# Patient Record
Sex: Female | Born: 1970
Health system: Southern US, Community
[De-identification: ages and names within clinical notes are randomized; demographics above are authoritative.]

## PROBLEM LIST (undated history)

## (undated) DIAGNOSIS — F329 Major depressive disorder, single episode, unspecified: Secondary | ICD-10-CM

## (undated) DIAGNOSIS — F32A Depression, unspecified: Secondary | ICD-10-CM

## (undated) DIAGNOSIS — I471 Supraventricular tachycardia, unspecified: Secondary | ICD-10-CM

## (undated) DIAGNOSIS — I341 Nonrheumatic mitral (valve) prolapse: Secondary | ICD-10-CM

## (undated) DIAGNOSIS — G43909 Migraine, unspecified, not intractable, without status migrainosus: Secondary | ICD-10-CM

## (undated) HISTORY — PX: APPENDECTOMY: SHX54

## (undated) HISTORY — PX: AUGMENTATION MAMMAPLASTY: SUR837

## (undated) HISTORY — PX: ABDOMINAL HYSTERECTOMY: SHX81

---

## 1997-04-02 HISTORY — PX: CARDIAC ELECTROPHYSIOLOGY STUDY AND ABLATION: SHX1294

## 1997-06-26 ENCOUNTER — Inpatient Hospital Stay (HOSPITAL_COMMUNITY): Admission: AD | Admit: 1997-06-26 | Discharge: 1997-06-26 | Payer: Self-pay | Admitting: Obstetrics & Gynecology

## 1997-07-07 ENCOUNTER — Ambulatory Visit (HOSPITAL_COMMUNITY): Admission: RE | Admit: 1997-07-07 | Discharge: 1997-07-07 | Payer: Self-pay | Admitting: Obstetrics and Gynecology

## 1997-07-09 ENCOUNTER — Inpatient Hospital Stay (HOSPITAL_COMMUNITY): Admission: AD | Admit: 1997-07-09 | Discharge: 1997-07-16 | Payer: Self-pay | Admitting: Cardiovascular Disease

## 1997-09-03 ENCOUNTER — Inpatient Hospital Stay (HOSPITAL_COMMUNITY): Admission: AD | Admit: 1997-09-03 | Discharge: 1997-09-05 | Payer: Self-pay | Admitting: Obstetrics and Gynecology

## 1997-09-03 ENCOUNTER — Encounter (HOSPITAL_COMMUNITY): Admission: RE | Admit: 1997-09-03 | Discharge: 1997-09-06 | Payer: Self-pay | Admitting: Obstetrics & Gynecology

## 1997-12-08 ENCOUNTER — Inpatient Hospital Stay (HOSPITAL_COMMUNITY): Admission: AD | Admit: 1997-12-08 | Discharge: 1997-12-11 | Payer: Self-pay | Admitting: Cardiology

## 1998-05-30 ENCOUNTER — Ambulatory Visit (HOSPITAL_COMMUNITY): Admission: RE | Admit: 1998-05-30 | Discharge: 1998-05-30 | Payer: Self-pay | Admitting: Obstetrics and Gynecology

## 1998-06-21 ENCOUNTER — Encounter: Admission: RE | Admit: 1998-06-21 | Discharge: 1998-09-19 | Payer: Self-pay | Admitting: Cardiology

## 2000-06-12 ENCOUNTER — Encounter (INDEPENDENT_AMBULATORY_CARE_PROVIDER_SITE_OTHER): Payer: Self-pay

## 2000-06-12 ENCOUNTER — Other Ambulatory Visit: Admission: RE | Admit: 2000-06-12 | Discharge: 2000-06-12 | Payer: Self-pay | Admitting: Obstetrics and Gynecology

## 2000-12-30 ENCOUNTER — Encounter: Payer: Self-pay | Admitting: Obstetrics and Gynecology

## 2000-12-30 ENCOUNTER — Encounter (INDEPENDENT_AMBULATORY_CARE_PROVIDER_SITE_OTHER): Payer: Self-pay | Admitting: Specialist

## 2000-12-30 ENCOUNTER — Ambulatory Visit (HOSPITAL_COMMUNITY): Admission: RE | Admit: 2000-12-30 | Discharge: 2000-12-30 | Payer: Self-pay | Admitting: Obstetrics and Gynecology

## 2001-12-18 ENCOUNTER — Other Ambulatory Visit: Admission: RE | Admit: 2001-12-18 | Discharge: 2001-12-18 | Payer: Self-pay | Admitting: Obstetrics and Gynecology

## 2002-12-01 ENCOUNTER — Other Ambulatory Visit: Admission: RE | Admit: 2002-12-01 | Discharge: 2002-12-01 | Payer: Self-pay | Admitting: Obstetrics and Gynecology

## 2003-02-03 ENCOUNTER — Ambulatory Visit (HOSPITAL_COMMUNITY): Admission: RE | Admit: 2003-02-03 | Discharge: 2003-02-03 | Payer: Self-pay | Admitting: *Deleted

## 2004-12-21 ENCOUNTER — Emergency Department (HOSPITAL_COMMUNITY): Admission: EM | Admit: 2004-12-21 | Discharge: 2004-12-21 | Payer: Self-pay | Admitting: Family Medicine

## 2004-12-25 ENCOUNTER — Inpatient Hospital Stay (HOSPITAL_COMMUNITY): Admission: AD | Admit: 2004-12-25 | Discharge: 2004-12-25 | Payer: Self-pay | Admitting: Obstetrics and Gynecology

## 2004-12-26 ENCOUNTER — Other Ambulatory Visit: Admission: RE | Admit: 2004-12-26 | Discharge: 2004-12-26 | Payer: Self-pay | Admitting: Obstetrics and Gynecology

## 2004-12-28 ENCOUNTER — Encounter (INDEPENDENT_AMBULATORY_CARE_PROVIDER_SITE_OTHER): Payer: Self-pay | Admitting: Specialist

## 2004-12-28 ENCOUNTER — Ambulatory Visit (HOSPITAL_COMMUNITY): Admission: RE | Admit: 2004-12-28 | Discharge: 2004-12-29 | Payer: Self-pay | Admitting: Obstetrics and Gynecology

## 2004-12-29 ENCOUNTER — Observation Stay (HOSPITAL_COMMUNITY): Admission: AD | Admit: 2004-12-29 | Discharge: 2004-12-30 | Payer: Self-pay | Admitting: Obstetrics and Gynecology

## 2005-02-09 ENCOUNTER — Ambulatory Visit: Payer: Self-pay | Admitting: Gastroenterology

## 2006-08-23 ENCOUNTER — Emergency Department (HOSPITAL_COMMUNITY): Admission: EM | Admit: 2006-08-23 | Discharge: 2006-08-23 | Payer: Self-pay | Admitting: Emergency Medicine

## 2006-10-15 ENCOUNTER — Emergency Department (HOSPITAL_COMMUNITY): Admission: EM | Admit: 2006-10-15 | Discharge: 2006-10-15 | Payer: Self-pay | Admitting: Emergency Medicine

## 2007-01-01 ENCOUNTER — Emergency Department (HOSPITAL_COMMUNITY): Admission: EM | Admit: 2007-01-01 | Discharge: 2007-01-01 | Payer: Self-pay | Admitting: Emergency Medicine

## 2008-08-18 ENCOUNTER — Encounter: Admission: RE | Admit: 2008-08-18 | Discharge: 2008-08-18 | Payer: Self-pay | Admitting: Family Medicine

## 2008-09-02 ENCOUNTER — Emergency Department (HOSPITAL_COMMUNITY): Admission: EM | Admit: 2008-09-02 | Discharge: 2008-09-02 | Payer: Self-pay | Admitting: Emergency Medicine

## 2008-11-12 ENCOUNTER — Emergency Department (HOSPITAL_COMMUNITY): Admission: EM | Admit: 2008-11-12 | Discharge: 2008-11-12 | Payer: Self-pay | Admitting: Emergency Medicine

## 2008-11-13 ENCOUNTER — Ambulatory Visit (HOSPITAL_COMMUNITY): Admission: RE | Admit: 2008-11-13 | Discharge: 2008-11-13 | Payer: Self-pay | Admitting: Emergency Medicine

## 2009-07-07 ENCOUNTER — Ambulatory Visit: Payer: Self-pay | Admitting: Psychology

## 2010-07-08 LAB — POCT I-STAT, CHEM 8
BUN: 14 mg/dL (ref 6–23)
Calcium, Ion: 0.96 mmol/L — ABNORMAL LOW (ref 1.12–1.32)
Chloride: 108 mEq/L (ref 96–112)
Creatinine, Ser: 0.9 mg/dL (ref 0.4–1.2)
Glucose, Bld: 72 mg/dL (ref 70–99)
HCT: 45 % (ref 36.0–46.0)
Sodium: 136 mEq/L (ref 135–145)

## 2010-07-08 LAB — DIFFERENTIAL
Basophils Relative: 3 % — ABNORMAL HIGH (ref 0–1)
Eosinophils Absolute: 0.1 10*3/uL (ref 0.0–0.7)
Eosinophils Relative: 3 % (ref 0–5)
Lymphocytes Relative: 24 % (ref 12–46)
Neutro Abs: 3.3 10*3/uL (ref 1.7–7.7)
Neutrophils Relative %: 61 % (ref 43–77)

## 2010-07-08 LAB — CBC: HCT: 44.6 % (ref 36.0–46.0)

## 2010-07-10 LAB — DIFFERENTIAL
Lymphocytes Relative: 17 % (ref 12–46)
Neutro Abs: 3.5 10*3/uL (ref 1.7–7.7)
Neutrophils Relative %: 71 % (ref 43–77)

## 2010-07-10 LAB — URINALYSIS, ROUTINE W REFLEX MICROSCOPIC
Glucose, UA: NEGATIVE mg/dL
Protein, ur: NEGATIVE mg/dL

## 2010-07-10 LAB — CBC
MCHC: 33.9 g/dL (ref 30.0–36.0)
RBC: 4.73 MIL/uL (ref 3.87–5.11)
RDW: 13.7 % (ref 11.5–15.5)

## 2010-07-10 LAB — SAMPLE TO BLOOD BANK

## 2010-08-18 NOTE — Op Note (Signed)
NAMECLEA, Dawn Burton NO.:  [id number]   MEDICAL RECORD NO.:  [id number]          PATIENT TYPE:  OIB   LOCATION:  1613                         FACILITY:  [facility]   PHYSICIAN:  Malva Limes, M.D.    DATE OF BIRTH:  1970-05-16   DATE OF PROCEDURE:  12/28/2004  DATE OF DISCHARGE:                                 OPERATIVE REPORT   PREOPERATIVE DIAGNOSES:  1.  Severe pelvic pain.  2.  Necrosing submucosal fibroid.  3.  Dysmenorrhea.   POSTOPERATIVE DIAGNOSES:  1.  Severe pelvic pain.  2.  Necrosing submucosal fibroid.  3.  Dysmenorrhea.   PROCEDURE:  Total vaginal hysterectomy.   SURGEON:  Malva Limes, M.D.   ASSISTANT:  Dr. Laural Roes.   ANESTHESIA:  General endotracheal.   ANTIBIOTICS:  Ancef 1 gm.   ESTIMATED BLOOD LOSS:  150 cc.   COMPLICATIONS:  None.   SPECIMENS:  Cervix, uterus, sent to pathology.   DRAINS:  Foley, bedside drainage.   PROCEDURE:  Patient was taken to the operating room, where she was placed in  dorsal supine position.  A general anesthetic was administered without  complications.  She was then placed in the dorsal lithotomy position.  She  was prepped with Hibiclens and draped in the usual fashion for this  procedure.  Her bladder was drained for a red rubber catheter.  A weighted  speculum was placed in the vagina.  The cervix was grasped with a single-  tooth tenaculum, and 10 cc of 1% lidocaine was injected into the cervix.  The posterior cul de sac was entered sharply.  The uterosacral ligaments  were bilaterally clamped, cut, and ligated with an 0 Monocryl suture.  The  cervix was then circumscribed.  The anterior cul de sac was entered sharply.  The bladder pillars were bilaterally clamped, cut, and ligated with 0  Monocryl suture.  The cardinal ligaments were serially clamped, cut, and  ligated with 0 Monocryl suture.  The uterine vessels were bilaterally  clamped, cut, and ligated with 0 Monocryl suture.  Once the level of  the  fallopian tube was reached, the uterus was inverted and the triple pedicle,  which included the fallopian tube, round ligament, and ovarian ligament,  were clamped, cut, and ligated x2 with 0 Monocryl suture.  The ovaries were  inspected and found to be normal.  The patient did have extensive dilation  of her infundibulopelvic ligament, consistent with a diagnosis of pelvic  congestive syndrome.  At this point, all pedicles were checked and found to  be hemostatic.  The posterior vaginal cuff was then run, using 2-0 Vicryl in  a running, locking fashion.  The uterosacral ligaments were reapproximated  in the midline.  The remaining vagina was then closed using 2-0 Vicryl in a  running, locking fashion.  The cuff was dried at the end of the procedure.  A Foley catheter was placed.  Clear yellow urine was seen.  The patient  tolerated the procedure well.  She was taken to the recovery room in stable  condition.  Instrument and lap counts were correct x2.  ______________________________  Malva Limes, M.D.     MA/MEDQ  D:  12/28/2004  T:  12/28/2004  Job:  [id number]

## 2010-08-18 NOTE — Discharge Summary (Signed)
[name], Dawn Burton NO.:  [id number]   MEDICAL RECORD NO.:  [id number]          PATIENT TYPE:  OIB   LOCATION:  1613                         FACILITY:  [facility]   PHYSICIAN:  Malva Limes, M.D.    DATE OF BIRTH:  1970-11-23   DATE OF ADMISSION:  12/28/2004  DATE OF DISCHARGE:  12/29/2004                                 DISCHARGE SUMMARY   PRINCIPAL DISCHARGE DIAGNOSES:  1.  Chronic pelvic pain.  2.  Uterine fibroids.  3.  Dysmenorrhea.  4.  Hematometra.   PROCEDURE:  Total vaginal hysterectomy.   HOSPITAL COURSE:  [name] is a 40 year old white female G2, P2-0-0-3 who  presented to [facility] on 12/28/04 for a total vaginal  hysterectomy secondary to pelvic pain, dysmenorrhea and necrosing submucosal  fibroid. The patient had an acute onset of pelvic pain approximately one  week prior to admission. She was seen in the emergency room. She had a CT  scan and pelvic ultrasound. The only thing that was identified during that  visit was what was thought to be a 3-cm submucosal necrosing fibroid. The  patient had significant pain requiring Dilaudid for relief. The patient does  have a history of cryoablation of the endometrium secondary to menorrhagia.  The patient was currently missing work because of the pain. The patient  underwent a total vaginal hysterectomy on 12/28/04. This was uncomplicated.  Blood loss was 150 cubic centimeters. Hemoglobin stable postop. The patient  was found to have hematometra at the time of the surgery. This was likely  the cause of the significant pain. The patient's postop course was benign.  She was ambulating without difficulty, eating a regular diet and passing  flatus at the time of discharge. She remained afebrile.   She was discharged to home on Dilaudid. She was instructed to follow up in  the office in 4 weeks.           ______________________________  Malva Limes, M.D.     MA/MEDQ  D:  12/29/2004  T:   12/29/2004  Job:  [id number]

## 2010-08-18 NOTE — Op Note (Signed)
Dawn Burton, Dawn Burton NO.:  [id number]   MEDICAL RECORD NO.:  [id number]                   PATIENT TYPE:  AMB   LOCATION:  SDC                                  FACILITY:  [facility]   PHYSICIAN:  Alfredia Ferguson, M.D.               DATE OF BIRTH:  [birth year]   DATE OF PROCEDURE:  02/03/2003  DATE OF DISCHARGE:  02/03/2003                                 OPERATIVE REPORT   PREOPERATIVE DIAGNOSIS:  Bilateral hypomastia.   POSTOPERATIVE DIAGNOSIS:  Bilateral hypomastia.   OPERATION PERFORMED:  Bilateral augmentation mammoplasty with McGhan smooth-  shell saline implants, 480 mL, inflated to a total volume of 510 mL on the  left and 510 mL on the right.  Implants placed in the submuscular position.   SURGEON:  Alfredia Ferguson, M.D.   ANESTHESIA:  General endotracheal anesthesia.   INDICATION FOR SURGERY:  This is a 40 year old woman who is undergoing GYN  surgery on today's date and wishes to undergo breast augmentation at the  same time.  She was advised regarding potential risks of the surgery,  including being made too large, not being made large enough, asymmetry of  the breasts, malposition of the implants, capsular contracture, rippling,  uncertain life span of the implants and the need to replace the implants on  multiple occasions over her lifetime, hematoma, seroma, infection, numbness  of the nipple-areolar complex, and overall dissatisfaction with the results.  In spite of these and other risks discussed with the patient, she wishes to  proceed with the operation.   DESCRIPTION OF SURGERY:  Following adequate general endotracheal anesthesia,  the patient's chest was prepped with Betadine and draped with sterile  drapes.  Skin marks were then placed prior to the patient being taken to the  operating room outlining the natural dimensions of the breasts.  Attention  was directed to the right breast, where a 3 cm inframammary crease incision  was  made and deepened until reaching the inferior pectoralis fascia.  The  fascia was opened inferiorly.  The muscle fibers were divided for a distance  of approximately 4 cm inferiorly and a subpectoral plane was reached.  Using  a combination of blunt and electrocautery dissection, a pocket of adequate  size to accommodate a 480 mL smooth-shell McGhan saline implant was created.  Hemostasis was meticulously maintained throughout the dissection.  Once the  pocket had been created, a saline implant was prepared by evacuating the air  and placing 100 mL of saline.  The implant was placed in the desired  position and then inflated to a total volume of 510 mL.  The fill tubing was  removed and the cap over the fill port was pushed down into the opening.  The deeper breast tissue was now closed using interrupted 3-0 Vicryl suture.  The dermis was closed on the skin incision using similar suture.  Steri-  Strips were used for  the skin edges.  Attention was directed to the right  breast, where an identical procedure was performed.  The exact amount of  saline was used to  inflate the saline implant.  Symmetry was acceptable at the conclusion of  the procedure.  Estimated blood loss was less than 10 mL.  A bulky dressing  was placed over the breasts and a circumferential wrap using a six-inch Ace  was used.  At the conclusion of the procedure, it was turned over to a  gynecologist for his surgical procedure.                                               Alfredia Ferguson, M.D.    WBB/MEDQ  D:  02/23/2003  T:  02/23/2003  Job:  [id number]

## 2010-08-18 NOTE — Op Note (Signed)
   NAMESHON, MANSOURI NO.:  [id number]   MEDICAL RECORD NO.:  [id number]                   PATIENT TYPE:  AMB   LOCATION:  SDC                                  FACILITY:  [facility]   PHYSICIAN:  Malva Limes, M.D.                 DATE OF BIRTH:  03-03-1971   DATE OF PROCEDURE:  02/03/2003  DATE OF DISCHARGE:                                 OPERATIVE REPORT   PREOPERATIVE DIAGNOSIS:  Menorrhagia.   POSTOPERATIVE DIAGNOSIS:  Menorrhagia.   PROCEDURE:  Endometrial ablation with the vapor probe.   SURGEON:  Malva Limes, M.D.   ANESTHESIA:  General.   ANTIBIOTICS:  Ancef 1 gram.   DRAINS:  Red rubber catheter, bladder.   ESTIMATED BLOOD LOSS:  Minimal.   COMPLICATIONS:  None.   SPECIMENS:  None.   PROCEDURE:  The patient was in the operating room for breast augmentation  prior to my arrival.  Once this was completed, the patient was placed in the  dorsal lithotomy position.  She was prepped with Hibiclens and her bladder  was drained with a red rubber catheter.  She was then draped in the usual  fashion for this procedure.  The sterile speculum was placed in the vagina,  the single-tooth tenaculum was applied to the anterior cervical lip.  The  cervical os was then serially dilated to a 31 [place]. The hysteroscope was  advanced into the endometrial cavity and examination revealed no endometrial  polyps or fibroids.  At this point the hysteroscope was removed and sharp  curettage was performed.  Following this, the resectoscope was set up with  the vapor probe set at 150 for cut and 60 for coag.  Following this the  resectoscope was placed in the uterine cavity and all areas of endometrium  were treated with the vapor probe.  Estimated fluid loss during the  procedure was 130 mL.  The patient tolerated the procedure well and she was  taken to the recovery room in stable condition.  Instrument and lap count  was correct x1.  The patient will be  discharged to home.  She will be  instructed to follow up in the office in four weeks.                                               Malva Limes, M.D.    MA/MEDQ  D:  02/03/2003  T:  02/03/2003  Job:  [id number]

## 2010-08-18 NOTE — Op Note (Signed)
[facility]  Patient:    Dawn Burton, Dawn Burton Visit Number: [id number] MRN: [id number]          Service Type: DSU Location: [facility] Attending Physician:  Osborn Coho Dictated by:   Janeece Riggers Dareen Piano, M.D. Proc. Date: 12/30/00 Admit Date:  12/30/2000                             Operative Report  PREOPERATIVE DIAGNOSIS:       Menorrhagia.  POSTOPERATIVE DIAGNOSIS:      Menorrhagia.  PROCEDURE:                    Endometrial ablation, cryoablation.  SURGEON:                      Mark E. Dareen Piano, M.D.  ANESTHESIA:                   General.  ESTIMATED BLOOD LOSS:         Minimal.  COMPLICATIONS:                None.  SPECIMENS:                    Endometrial curettings sent to pathology.  DRAINS:                       Red rubber catheter to bladder.  ANTIBIOTICS:                  Ancef 1 g.  DESCRIPTION OF PROCEDURE:     The patient was taken to the operating room where she was placed in a dorsal supine position. A general anesthetic was administered without complications. She was then placed in dorsal lithotomy position and prepped with Hibiclens. She was draped in the usual fashion for this procedure. A sterile speculum was placed in the vagina. A single tooth tenaculum was applied to the anterior cervical lip. The uterus was then sounded to 9 cm. The cervical os was serially dilated to a 27 [place]. A curettage was then performed with endometrial curettings sent to pathology. The cryo probe was then placed in the right cornua and turned on for six minutes. Ultrasound observation demonstrated that the probe tip was in the right cornua, well formed cryo ball was seen to develop around the probe. A similar procedure was performed on the opposite side. Again, both sides were treated for six minutes. This concluded the procedure. The speculum was removed. The patient was then taken to the recovery room in stable condition. She will be  discharged to home. She will follow up in the office in four weeks. She will be sent home with Tylox and Anaprox Double Strength to take as needed. Dictated by:   Janeece Riggers Dareen Piano, M.D. Attending Physician:  Osborn Coho DD:  12/30/00 TD:  12/30/00 Job: [id number]

## 2011-01-11 LAB — I-STAT 8, (EC8 V) (CONVERTED LAB)
Acid-base deficit: 1
BUN: 8
Chloride: 105
HCT: 47 — ABNORMAL HIGH
Sodium: 139
TCO2: 25
pH, Ven: 7.385 — ABNORMAL HIGH

## 2011-01-11 LAB — POCT CARDIAC MARKERS
CKMB, poc: <1 — ABNORMAL LOW
Myoglobin, poc: 70.8
Operator id: 294521

## 2011-01-11 LAB — POCT I-STAT CREATININE
Creatinine, Ser: 1
Operator id: 294521

## 2011-01-11 LAB — PROTIME-INR: INR: 1

## 2011-01-15 LAB — STOOL CULTURE

## 2011-01-15 LAB — CLOSTRIDIUM DIFFICILE EIA

## 2011-01-15 LAB — GIARDIA/CRYPTOSPORIDIUM SCREEN(EIA): Giardia Screen - EIA: NEGATIVE

## 2012-10-18 ENCOUNTER — Emergency Department (HOSPITAL_COMMUNITY)
Admission: EM | Admit: 2012-10-18 | Discharge: 2012-10-18 | Disposition: A | Discharge status: Home / Self Care | Payer: BC Managed Care – PPO | Source: Home / Self Care

## 2012-10-18 ENCOUNTER — Encounter (HOSPITAL_COMMUNITY): Payer: Self-pay | Admitting: Emergency Medicine

## 2012-10-18 DIAGNOSIS — J029 Acute pharyngitis, unspecified: Secondary | ICD-10-CM

## 2012-10-18 HISTORY — DX: Migraine, unspecified, not intractable, without status migrainosus: G43.909

## 2012-10-18 MED ORDER — METHYLPREDNISOLONE ACETATE 40 MG/ML IJ SUSP
80.0000 mg | Freq: Once | INTRAMUSCULAR | Status: AC
  Administered 2012-10-18: 80 mg via INTRAMUSCULAR

## 2012-10-18 MED ORDER — FLUTICASONE PROPIONATE 50 MCG/ACT NA SUSP: 2.0000 | Freq: Every day | NASAL | Status: DC

## 2012-10-18 MED ORDER — METHYLPREDNISOLONE ACETATE 80 MG/ML IJ SUSP
INTRAMUSCULAR | Status: AC
  Filled 2012-10-18: qty 1

## 2012-10-18 MED ORDER — FEXOFENADINE-PSEUDOEPHED ER 60-120 MG PO TB12: 1.0000 | ORAL_TABLET | Freq: Two times a day (BID) | ORAL | Status: DC

## 2012-10-18 MED ORDER — GUAIFENESIN 100 MG/5ML PO LIQD: 100.0000 mg | Freq: Three times a day (TID) | ORAL | Status: DC | PRN

## 2012-10-18 NOTE — ED Notes (Signed)
Patient has a sore throat and swollen neck, sore neck for one week.  Reports sore throat worsening.  Reports ears are itchy and stuffy.  Denies runny nose.  Patient also complains of feeling off balance.

## 2012-10-18 NOTE — ED Provider Notes (Signed)
History    CSN: [id number] Arrival date & time 10/18/12  1611  None    Chief Complaint  Patient presents with  . Sore Throat   (Consider location/radiation/quality/duration/timing/severity/associated sxs/prior Treatment) HPI  42 yo wf presents with above complaint.  Sore throat x 7 days.  Pain with swallowing.  At times feels like her throat is closing off.  Denies fever, chills, dyspnea, cough.  Has postnasal drainage, nasal congestion.  Hx of seasonal allergies but not currently taking anything for this.   Past Medical History  Diagnosis Date  . Migraines    Past Surgical History  Procedure Laterality Date  . Abdominal hysterectomy     No family history on file. History  Substance Use Topics  . Smoking status: Never Smoker   . Smokeless tobacco: Not on file  . Alcohol Use: Yes   OB History   Grav Para Term Preterm Abortions TAB SAB Ect Mult Living                 Review of Systems  Constitutional: Negative for fever, chills, appetite change, fatigue and unexpected weight change.  HENT: Positive for congestion, neck pain and postnasal drip. Negative for ear pain and ear discharge.   Eyes: Negative.   Respiratory: Negative for apnea, cough, choking, chest tightness, shortness of breath, wheezing and stridor.   Cardiovascular: Negative.   Gastrointestinal: Negative.   Genitourinary: Negative.   Skin: Negative.   Neurological: Negative.   Hematological: Negative.   Psychiatric/Behavioral: Negative.     Allergies  Review of patient's allergies indicates no known allergies.  Home Medications   Current Outpatient Rx  Name  Route  Sig  Dispense  Refill  . topiramate (TOPAMAX) 50 MG tablet   Oral   Take 50 mg by mouth 2 (two) times daily.         . fexofenadine-pseudoephedrine (ALLEGRA-D) 60-120 MG per tablet   Oral   Take 1 tablet by mouth every 12 (twelve) hours.   30 tablet   1   . fluticasone (FLONASE) 50 MCG/ACT nasal spray   Nasal   Place 2 sprays  into the nose daily.   16 g   2   . guaiFENesin (ROBITUSSIN) 100 MG/5ML liquid   Oral   Take 5-10 mLs (100-200 mg total) by mouth 3 (three) times daily as needed for cough.   60 mL   1    BP 126/76  Pulse 52  Temp(Src) 98.2 F (36.8 C) (Oral)  Resp 16  SpO2 100% Physical Exam  Constitutional: She is oriented to person, place, and time. She appears well-developed and well-nourished.  HENT:  Head: Normocephalic and atraumatic.  Right Ear: External ear normal.  Left Ear: External ear normal.  Mouth/Throat: No oropharyngeal exudate.  Mild redness throat.   Eyes: Conjunctivae and EOM are normal. Pupils are equal, round, and reactive to light.  Neck: Normal range of motion. No JVD present. No tracheal deviation present. No thyromegaly present.  Cardiovascular: Normal rate and normal heart sounds.   Bradycardic   Pulmonary/Chest: Effort normal and breath sounds normal. No respiratory distress. She has no wheezes. She has no rales.  Musculoskeletal: Normal range of motion.  Lymphadenopathy:    She has cervical adenopathy.  Neurological: She is alert and oriented to person, place, and time.  Skin: Skin is warm and dry.  Psychiatric: She has a normal mood and affect.    ED Course  Procedures (including critical care time) Labs Reviewed  POCT  RAPID STREP A ([facility]ARE ONLY)   No results found. 1. Viral pharyngitis     MDM  Patient will f/u with [place] in 2-3 days if not getting better.  Must go to ED if she feels like she is having issues with swallowing or breathing.  Voices understanding.  All questions answered.    Meds ordered this encounter  Medications  . topiramate (TOPAMAX) 50 MG tablet    Sig: Take 50 mg by mouth 2 (two) times daily.  . methylPREDNISolone acetate (DEPO-MEDROL) injection 80 mg    Sig:   . fexofenadine-pseudoephedrine (ALLEGRA-D) 60-120 MG per tablet    Sig: Take 1 tablet by mouth every 12 (twelve) hours.    Dispense:  30 tablet    Refill:  1  .  fluticasone (FLONASE) 50 MCG/ACT nasal spray    Sig: Place 2 sprays into the nose daily.    Dispense:  16 g    Refill:  2  . guaiFENesin (ROBITUSSIN) 100 MG/5ML liquid    Sig: Take 5-10 mLs (100-200 mg total) by mouth 3 (three) times daily as needed for cough.    Dispense:  60 mL    Refill:  1    Zonia Kief, PA-C 10/18/12 1747

## 2012-10-18 NOTE — ED Provider Notes (Signed)
Medical screening examination/treatment/procedure(s) were performed by resident physician or non-physician practitioner and as supervising physician I was immediately available for consultation/collaboration.   Barkley Bruns MD.   Linna Hoff, MD 10/18/12 [phone]

## 2012-10-21 LAB — CULTURE, GROUP A STREP

## 2014-08-16 ENCOUNTER — Emergency Department (INDEPENDENT_AMBULATORY_CARE_PROVIDER_SITE_OTHER): Payer: BLUE CROSS/BLUE SHIELD

## 2014-08-16 ENCOUNTER — Encounter (HOSPITAL_COMMUNITY): Payer: Self-pay | Admitting: Emergency Medicine

## 2014-08-16 ENCOUNTER — Emergency Department (HOSPITAL_COMMUNITY)
Admission: EM | Admit: 2014-08-16 | Discharge: 2014-08-16 | Disposition: A | Discharge status: Home / Self Care | Payer: BLUE CROSS/BLUE SHIELD | Source: Home / Self Care | Attending: Family Medicine | Admitting: Family Medicine

## 2014-08-16 DIAGNOSIS — M778 Other enthesopathies, not elsewhere classified: Secondary | ICD-10-CM | POA: Diagnosis not present

## 2014-08-16 DIAGNOSIS — R21 Rash and other nonspecific skin eruption: Secondary | ICD-10-CM | POA: Diagnosis not present

## 2014-08-16 DIAGNOSIS — M25829 Other specified joint disorders, unspecified elbow: Secondary | ICD-10-CM

## 2014-08-16 MED ORDER — PREDNISONE 10 MG (48) PO TBPK: ORAL_TABLET | Freq: Every day | ORAL | Status: DC

## 2014-08-16 NOTE — ED Notes (Signed)
Pt states she is breaking out with a rash and not sure if its poison oak or poison ivy or if its allergic reaction to something she ate.

## 2014-08-16 NOTE — ED Provider Notes (Signed)
Dawn Burton is a 44 y.o. female who presents to Urgent Care today for elbow pain. 1) rash: Patient developed a rash on her trunk and extremities starting this morning. It is not particularly itchy. She denies any fevers or chills nausea vomiting or diarrhea. No new medications of detergents shampoos cosmetics etc. She has not tried any treatment yet. No tick bites. 2) left elbow pain: Present for about a month occurring after she fell on the elbow. No radiating pain weakness or numbness. She notes pain in the posterior aspect of the elbow with full extension and full flexion. She denies any clicking locking or catching. No radiating pain weakness or numbness fevers or chills.   Past Medical History  Diagnosis Date  . Migraines    Past Surgical History  Procedure Laterality Date  . Abdominal hysterectomy     History  Substance Use Topics  . Smoking status: Never Smoker   . Smokeless tobacco: Not on file  . Alcohol Use: Yes   ROS as above Medications: No current facility-administered medications for this encounter.   Current Outpatient Prescriptions  Medication Sig Dispense Refill  . fexofenadine-pseudoephedrine (ALLEGRA-D) 60-120 MG per tablet Take 1 tablet by mouth every 12 (twelve) hours. 30 tablet 1  . fluticasone (FLONASE) 50 MCG/ACT nasal spray Place 2 sprays into the nose daily. 16 g 2  . guaiFENesin (ROBITUSSIN) 100 MG/5ML liquid Take 5-10 mLs (100-200 mg total) by mouth 3 (three) times daily as needed for cough. 60 mL 1  . topiramate (TOPAMAX) 50 MG tablet Take 50 mg by mouth 2 (two) times daily.     No Known Allergies   Exam:  BP 130/85 mmHg  Pulse 65  Temp(Src) 97.5 F (36.4 C) (Oral)  Resp 16  SpO2 97% Gen: Well NAD HEENT: EOMI,  MMM no oral lesions or tongue or lip swelling Lungs: Normal work of breathing. CTABL Heart: RRR no MRG Abd: NABS, Soft. Nondistended, Nontender Exts: Brisk capillary refill, warm and well perfused.  Skin: Maculopapular  erythematous rash on trunk and extremities. Blanchable. Nontender. Left arm: shoulder nontender normal motion Elbow normal-appearing no swelling mildly tender posterior elbow and olecranon fossa She lacks full extension and flexion by about 5 and has pain with both. Excellent normal supination and pronation. No locking or catching. Wrist nontender normal pulses normal motion Hand normal grip strength sensation and capillary refill  No results found for this or any previous visit (from the past 24 hour(s)). Dg Elbow Complete Left  08/16/2014   CLINICAL DATA:  Injury 1 month ago. Pain with extension. Question loose body.  EXAM: LEFT ELBOW - COMPLETE 3+ VIEW  COMPARISON:  None.  FINDINGS: There is no evidence of fracture, dislocation, or joint effusion. There is no evidence of arthropathy or other focal bone abnormality. Soft tissues are unremarkable.  IMPRESSION: Negative.   Electronically Signed   By: Rolm Baptise M.D.   On: 08/16/2014 12:51    Assessment and Plan: 44 y.o. female with  1) rash: Unclear dermatitis. Likely allergic. Treat with prednisone Dosepak  2) elbow pain: likely olecranon impingement. Doubtful for loose body over this possibility. Plan on prednisone dose pack as above and work on full extension range of motion. Follow-up with orthopedics if not better.    Discussed warning signs or symptoms. Please see discharge instructions. Patient expresses understanding.     Gregor Hams, MD 08/16/14 [phone]

## 2014-08-16 NOTE — Discharge Instructions (Signed)
Thank you for coming in today. Keep an eye on the rash.  Return if it is worsening.  Take the prednisone.  Work on straightening the elbow  Rash A rash is a change in the color or texture of your skin. There are many different types of rashes. You may have other problems that accompany your rash. CAUSES   Infections.  Allergic reactions. This can include allergies to pets or foods.  Certain medicines.  Exposure to certain chemicals, soaps, or cosmetics.  Heat.  Exposure to poisonous plants.  Tumors, both cancerous and noncancerous. SYMPTOMS   Redness.  Scaly skin.  Itchy skin.  Dry or cracked skin.  Bumps.  Blisters.  Pain. DIAGNOSIS  Your caregiver may do a physical exam to determine what type of rash you have. A skin sample (biopsy) may be taken and examined under a microscope. TREATMENT  Treatment depends on the type of rash you have. Your caregiver may prescribe certain medicines. For serious conditions, you may need to see a skin doctor (dermatologist). HOME CARE INSTRUCTIONS   Avoid the substance that caused your rash.  Do not scratch your rash. This can cause infection.  You may take cool baths to help stop itching.  Only take over-the-counter or prescription medicines as directed by your caregiver.  Keep all follow-up appointments as directed by your caregiver. SEEK IMMEDIATE MEDICAL CARE IF:  You have increasing pain, swelling, or redness.  You have a fever.  You have new or severe symptoms.  You have body aches, diarrhea, or vomiting.  Your rash is not better after 3 days. MAKE SURE YOU:  Understand these instructions.  Will watch your condition.  Will get help right away if you are not doing well or get worse. Document Released: 03/09/2002 Document Revised: 06/11/2011 Document Reviewed: 01/01/2011 [facility] Patient Information 2015 [place], [place]. This information is not intended to replace advice given to you by your health care  provider. Make sure you discuss any questions you have with your health care provider.

## 2014-12-23 ENCOUNTER — Other Ambulatory Visit: Payer: Self-pay | Admitting: Family Medicine

## 2014-12-23 DIAGNOSIS — R52 Pain, unspecified: Secondary | ICD-10-CM

## 2014-12-30 ENCOUNTER — Other Ambulatory Visit: Payer: Self-pay

## 2014-12-30 ENCOUNTER — Ambulatory Visit
Admission: RE | Admit: 2014-12-30 | Discharge: 2014-12-30 | Disposition: A | Discharge status: Home / Self Care | Payer: BLUE CROSS/BLUE SHIELD | Source: Ambulatory Visit | Attending: Family Medicine | Admitting: Family Medicine

## 2014-12-30 DIAGNOSIS — R52 Pain, unspecified: Secondary | ICD-10-CM

## 2014-12-30 DIAGNOSIS — Z1231 Encounter for screening mammogram for malignant neoplasm of breast: Secondary | ICD-10-CM

## 2015-01-19 ENCOUNTER — Ambulatory Visit
Admission: RE | Admit: 2015-01-19 | Discharge: 2015-01-19 | Disposition: A | Discharge status: Home / Self Care | Payer: BLUE CROSS/BLUE SHIELD | Source: Ambulatory Visit

## 2015-01-19 ENCOUNTER — Ambulatory Visit: Payer: BLUE CROSS/BLUE SHIELD

## 2015-01-19 DIAGNOSIS — Z1231 Encounter for screening mammogram for malignant neoplasm of breast: Secondary | ICD-10-CM

## 2015-09-18 ENCOUNTER — Encounter (HOSPITAL_COMMUNITY): Payer: Self-pay | Admitting: Emergency Medicine

## 2015-09-18 ENCOUNTER — Observation Stay (HOSPITAL_COMMUNITY)
Admission: EM | Admit: 2015-09-18 | Discharge: 2015-09-20 | Disposition: A | Discharge status: Home / Self Care | Payer: BLUE CROSS/BLUE SHIELD | Attending: General Surgery | Admitting: General Surgery

## 2015-09-18 ENCOUNTER — Emergency Department (HOSPITAL_COMMUNITY): Payer: BLUE CROSS/BLUE SHIELD

## 2015-09-18 DIAGNOSIS — G43909 Migraine, unspecified, not intractable, without status migrainosus: Secondary | ICD-10-CM | POA: Diagnosis not present

## 2015-09-18 DIAGNOSIS — K3580 Unspecified acute appendicitis: Secondary | ICD-10-CM

## 2015-09-18 DIAGNOSIS — I341 Nonrheumatic mitral (valve) prolapse: Secondary | ICD-10-CM | POA: Diagnosis not present

## 2015-09-18 DIAGNOSIS — R1031 Right lower quadrant pain: Secondary | ICD-10-CM

## 2015-09-18 DIAGNOSIS — K353 Acute appendicitis with localized peritonitis, without perforation or gangrene: Secondary | ICD-10-CM

## 2015-09-18 DIAGNOSIS — R109 Unspecified abdominal pain: Secondary | ICD-10-CM | POA: Diagnosis present

## 2015-09-18 DIAGNOSIS — I471 Supraventricular tachycardia: Secondary | ICD-10-CM | POA: Insufficient documentation

## 2015-09-18 HISTORY — DX: Nonrheumatic mitral (valve) prolapse: I34.1

## 2015-09-18 HISTORY — DX: Unspecified acute appendicitis: K35.80

## 2015-09-18 LAB — DIFFERENTIAL
Basophils Absolute: 0 10*3/uL (ref 0.0–0.1)
Basophils Relative: 0 %
EOS ABS: 0.1 10*3/uL (ref 0.0–0.7)
EOS PCT: 1 %
LYMPHS ABS: 0.6 10*3/uL — AB (ref 0.7–4.0)
Lymphocytes Relative: 5 %
MONOS PCT: 9 %
Monocytes Absolute: 1.1 10*3/uL — ABNORMAL HIGH (ref 0.1–1.0)
NEUTROS PCT: 85 %
Neutro Abs: 10.8 10*3/uL — ABNORMAL HIGH (ref 1.7–7.7)

## 2015-09-18 LAB — COMPREHENSIVE METABOLIC PANEL
ALK PHOS: 70 U/L (ref 38–126)
ALT: 17 U/L (ref 14–54)
AST: 15 U/L (ref 15–41)
Albumin: 4 g/dL (ref 3.5–5.0)
Anion gap: 9 (ref 5–15)
BUN: 11 mg/dL (ref 6–20)
CALCIUM: 9.1 mg/dL (ref 8.9–10.3)
CHLORIDE: 107 mmol/L (ref 101–111)
CO2: 20 mmol/L — ABNORMAL LOW (ref 22–32)
CREATININE: 0.73 mg/dL (ref 0.44–1.00)
GFR calc Af Amer: >60 mL/min (ref >60)
Glucose, Bld: 110 mg/dL — ABNORMAL HIGH (ref 65–99)
Potassium: 3.9 mmol/L (ref 3.5–5.1)
Sodium: 136 mmol/L (ref 135–145)
Total Bilirubin: 1.1 mg/dL (ref 0.3–1.2)
Total Protein: 6.7 g/dL (ref 6.5–8.1)

## 2015-09-18 LAB — CBC
HCT: 45.4 % (ref 36.0–46.0)
Hemoglobin: 15.3 g/dL — ABNORMAL HIGH (ref 12.0–15.0)
MCH: 29.2 pg (ref 26.0–34.0)
MCHC: 33.7 g/dL (ref 30.0–36.0)
MCV: 86.6 fL (ref 78.0–100.0)
PLATELETS: 287 10*3/uL (ref 150–400)
RBC: 5.24 MIL/uL — ABNORMAL HIGH (ref 3.87–5.11)
RDW: 12.7 % (ref 11.5–15.5)
WBC: 15.5 10*3/uL — AB (ref 4.0–10.5)

## 2015-09-18 LAB — I-STAT BETA HCG BLOOD, ED (MC, WL, AP ONLY): I-stat hCG, quantitative: <5 m[IU]/mL (ref <5)

## 2015-09-18 LAB — LIPASE, BLOOD: LIPASE: 23 U/L (ref 11–51)

## 2015-09-18 MED ORDER — ONDANSETRON 4 MG PO TBDP
8.0000 mg | ORAL_TABLET | Freq: Once | ORAL | Status: AC
  Administered 2015-09-18: 8 mg via ORAL
  Filled 2015-09-18: qty 2

## 2015-09-18 MED ORDER — IOPAMIDOL (ISOVUE-300) INJECTION 61%
INTRAVENOUS | Status: AC
  Administered 2015-09-18: 100 mL
  Filled 2015-09-18: qty 100

## 2015-09-18 MED ORDER — DEXTROSE 5 % IV SOLN
2.0000 g | Freq: Once | INTRAVENOUS | Status: AC
  Administered 2015-09-18: 2 g via INTRAVENOUS
  Filled 2015-09-18: qty 2

## 2015-09-18 MED ORDER — METRONIDAZOLE IN NACL 5-0.79 MG/ML-% IV SOLN
500.0000 mg | Freq: Once | INTRAVENOUS | Status: AC
  Administered 2015-09-18: 500 mg via INTRAVENOUS
  Filled 2015-09-18: qty 100

## 2015-09-18 MED ORDER — SODIUM CHLORIDE 0.9 % IV SOLN
Freq: Once | INTRAVENOUS | Status: AC
  Administered 2015-09-18: 23:00:00 via INTRAVENOUS

## 2015-09-18 MED ORDER — SODIUM CHLORIDE 0.9 % IV BOLUS (SEPSIS)
1000.0000 mL | Freq: Once | INTRAVENOUS | Status: AC
  Administered 2015-09-18: 1000 mL via INTRAVENOUS

## 2015-09-18 MED ORDER — MORPHINE SULFATE (PF) 4 MG/ML IV SOLN
8.0000 mg | Freq: Once | INTRAVENOUS | Status: AC
  Administered 2015-09-18: 8 mg via INTRAVENOUS
  Filled 2015-09-18: qty 2

## 2015-09-18 NOTE — H&P (Signed)
Dawn Burton is an 46 y.o. female.   Chief Complaint: RLQ abdominal pain HPI: 45 yo female in good health presents with one day of generalized weakness with right lower quadrant abdominal pressure.  Anorexic today.  Pain has worsened and localized to the RLQ.  Subjective fever.  CT scan showed appendicitis without signs of perforation.  Past Medical History  Diagnosis Date  . Migraines   . MVP (mitral valve prolapse)     Past Surgical History  Procedure Laterality Date  . Abdominal hysterectomy      History reviewed. No pertinent family history. Social History:  reports that she has never smoked. She does not have any smokeless tobacco history on file. She reports that she drinks alcohol. She reports that she does not use illicit drugs.  Allergies:  Allergies  Allergen Reactions  . Other Other (See Comments)    Medication for kidney infection caused hallucinations    Prior to Admission medications   Medication Sig Start Date End Date Taking? Authorizing Provider  predniSONE (STERAPRED UNI-PAK 48 TAB) 10 MG (48) TBPK tablet Take by mouth daily. 08/16/14   Gregor Hams, MD  topiramate (TOPAMAX) 50 MG tablet Take 50 mg by mouth 2 (two) times daily.    Historical Provider, MD     Results for orders placed or performed during the hospital encounter of 09/18/15 (from the past 48 hour(s))  Lipase, blood     Status: None   Collection Time: 09/18/15  5:50 PM  Result Value Ref Range   Lipase 23 11 - 51 U/L  Comprehensive metabolic panel     Status: Abnormal   Collection Time: 09/18/15  5:50 PM  Result Value Ref Range   Sodium 136 135 - 145 mmol/L   Potassium 3.9 3.5 - 5.1 mmol/L   Chloride 107 101 - 111 mmol/L   CO2 20 (L) 22 - 32 mmol/L   Glucose, Bld 110 (H) 65 - 99 mg/dL   BUN 11 6 - 20 mg/dL   Creatinine, Ser 0.73 0.44 - 1.00 mg/dL   Calcium 9.1 8.9 - 10.3 mg/dL   Total Protein 6.7 6.5 - 8.1 g/dL   Albumin 4.0 3.5 - 5.0 g/dL   AST 15 15 - 41 U/L   ALT 17 14 - 54 U/L    Alkaline Phosphatase 70 38 - 126 U/L   Total Bilirubin 1.1 0.3 - 1.2 mg/dL   GFR calc non Af Amer >60 >60 mL/min   GFR calc Af Amer >60 >60 mL/min    Comment: (NOTE) The eGFR has been calculated using the CKD EPI equation. This calculation has not been validated in all clinical situations. eGFR's persistently <60 mL/min signify possible Chronic Kidney Disease.    Anion gap 9 5 - 15  CBC     Status: Abnormal   Collection Time: 09/18/15  5:50 PM  Result Value Ref Range   WBC 15.5 (H) 4.0 - 10.5 K/uL   RBC 5.24 (H) 3.87 - 5.11 MIL/uL   Hemoglobin 15.3 (H) 12.0 - 15.0 g/dL   HCT 45.4 36.0 - 46.0 %   MCV 86.6 78.0 - 100.0 fL   MCH 29.2 26.0 - 34.0 pg   MCHC 33.7 30.0 - 36.0 g/dL   RDW 12.7 11.5 - 15.5 %   Platelets 287 150 - 400 K/uL  Differential     Status: Abnormal   Collection Time: 09/18/15  7:18 PM  Result Value Ref Range   Neutrophils Relative % 85 %  Neutro Abs 10.8 (H) 1.7 - 7.7 K/uL   Lymphocytes Relative 5 %   Lymphs Abs 0.6 (L) 0.7 - 4.0 K/uL   Monocytes Relative 9 %   Monocytes Absolute 1.1 (H) 0.1 - 1.0 K/uL   Eosinophils Relative 1 %   Eosinophils Absolute 0.1 0.0 - 0.7 K/uL   Basophils Relative 0 %   Basophils Absolute 0.0 0.0 - 0.1 K/uL  I-Stat Beta hCG blood, ED ([facility], [facility], [facility] only)     Status: None   Collection Time: 09/18/15  7:21 PM  Result Value Ref Range   I-stat hCG, quantitative <5.0 <5 mIU/mL   Comment 3            Comment:   GEST. AGE      CONC.  (mIU/mL)   <=1 WEEK        5 - 50     2 WEEKS       50 - 500     3 WEEKS       100 - 10,000     4 WEEKS     1,000 - 30,000        FEMALE AND NON-PREGNANT FEMALE:     LESS THAN 5 mIU/mL    Ct Abdomen Pelvis W Contrast  09/18/2015  CLINICAL DATA:  45 year old female with right lower quadrant abdominal pain EXAM: CT ABDOMEN AND PELVIS WITH CONTRAST TECHNIQUE: Multidetector CT imaging of the abdomen and pelvis was performed using the standard protocol following bolus administration of intravenous contrast.  CONTRAST:  119m ISOVUE-300 IOPAMIDOL (ISOVUE-300) INJECTION 61% COMPARISON:  CT dated 09/02/2008 FINDINGS: This the visualized lung bases are clear. 60 no intra-abdominal free air or free fluid. This is the liver, gallbladder, pancreas, spleen, and the right adrenal gland appear unremarkable. There is linear calcification of the left adrenal gland, likely related to prior insult. This is similar to prior study. The kidneys, visualized ureters, and urinary bladder appear unremarkable. Hysterectomy. There is a 3.8 cm left ovarian dominant follicle/ cyst. Ultrasound may provide better evaluation of the pelvic structures. There is no evidence of bowel obstruction. The appendix is inflamed. There is stone is at the base of the appendix. There is stranding and small amount of inflammatory fluid surrounding the appendix. The appendix is located in the right lower quadrant inferior to the cecum. There is no drainable fluid collection or abscess. The abdominal aorta and IVC appear unremarkable. No portal venous gas identified. There is no adenopathy. There is a diastases of anterior abdominal wall musculature in the midline with a small fat containing umbilical hernia. Bilateral breast implants partially visualized. The osseous structures are intact. IMPRESSION: Acute appendicitis.  No abscess. Electronically Signed   By: AAnner CreteM.D.   On: 09/18/2015 21:32    ROS  Blood pressure 114/71, pulse 81, temperature 98.2 F (36.8 C), temperature source Oral, resp. rate 16, SpO2 93 %. Physical Exam   Assessment/Plan Acute appendicitis with no sign of perforation  Admit to hospital IV antibiotics Laparoscopic appendectomy by Dr. BBarry Dienesin AM.  The surgical procedure has been discussed with the patient.  Potential risks, benefits, alternative treatments, and expected outcomes have been explained.  All of the patient's questions at this time have been answered.  The likelihood of reaching the patient's treatment  goal is good.  The patient understand the proposed surgical procedure and wishes to proceed.   [name], MD 09/18/2015, 11:53 PM

## 2015-09-18 NOTE — ED Notes (Signed)
No unrinary symptoms

## 2015-09-18 NOTE — ED Notes (Signed)
Pt here for RLQ pain starting today that is severe in nature

## 2015-09-18 NOTE — ED Notes (Signed)
Report given to rn on 6n      

## 2015-09-18 NOTE — ED Provider Notes (Signed)
CSN: [id number]     Arrival date & time 09/18/15  1743 History   First MD Initiated Contact with Patient 09/18/15 1804     Chief Complaint  Patient presents with  . Abdominal Pain     (Consider location/radiation/quality/duration/timing/severity/associated sxs/prior Treatment) HPI 45 year old female who presents with abdominal pain. History of abdominal hysterectomy. States that she is felt mildly weak over the past 2-3 days. This morning felt worsening generalized weakness with right lower quadrant abdominal pressure. Has had no appetite and has not eaten or drinking anything throughout the course of today. With worsening right-sided abdominal pain throughout the course of the day. Denies postprandial abdominal pain, nausea or vomiting, diarrhea. Had a small bowel movement this morning which was normal. Subjective fevers and chills. No known sick contacts. No dysuria or urinary frequency. Mild right flank discomfort. Past Medical History  Diagnosis Date  . Migraines   . MVP (mitral valve prolapse)    Past Surgical History  Procedure Laterality Date  . Abdominal hysterectomy     History reviewed. No pertinent family history. Social History  Substance Use Topics  . Smoking status: Never Smoker   . Smokeless tobacco: None  . Alcohol Use: Yes   OB History    No data available     Review of Systems 10/14 systems reviewed and are negative other than those stated in the HPI    Allergies  Other  Home Medications   Prior to Admission medications   Medication Sig Start Date End Date Taking? Authorizing Provider  predniSONE (STERAPRED UNI-PAK 48 TAB) 10 MG (48) TBPK tablet Take by mouth daily. 08/16/14   Gregor Hams, MD  topiramate (TOPAMAX) 50 MG tablet Take 50 mg by mouth 2 (two) times daily.    Historical Provider, MD   BP 124/62 mmHg  Pulse 72  Temp(Src) 97.4 F (36.3 C) (Oral)  Resp 20  SpO2 97% Physical Exam Physical Exam  Nursing note and vitals  reviewed. Constitutional: Well developed, well nourished, non-toxic, and in no acute distress Head: Normocephalic and atraumatic.  Mouth/Throat: Oropharynx is clear and moist.  Neck: Normal range of motion. Neck supple.  Cardiovascular: Normal rate and regular rhythm.   Pulmonary/Chest: Effort normal and breath sounds normal.  Abdominal: Soft. There is Right sided abdominal tenderness. Minimal right CVA tenderness. There is no rebound and no guarding.  Musculoskeletal: Normal range of motion.  Neurological: Alert, no facial droop, fluent speech, moves all extremities symmetrically Skin: Skin is warm and dry.  Psychiatric: Cooperative  ED Course  Procedures (including critical care time) Labs Review Labs Reviewed  COMPREHENSIVE METABOLIC PANEL - Abnormal; Notable for the following:    CO2 20 (*)    Glucose, Bld 110 (*)    All other components within normal limits  CBC - Abnormal; Notable for the following:    WBC 15.5 (*)    RBC 5.24 (*)    Hemoglobin 15.3 (*)    All other components within normal limits  DIFFERENTIAL - Abnormal; Notable for the following:    Neutro Abs 10.8 (*)    Lymphs Abs 0.6 (*)    Monocytes Absolute 1.1 (*)    All other components within normal limits  LIPASE, BLOOD  URINALYSIS, ROUTINE W REFLEX MICROSCOPIC (NOT AT [facility])  I-STAT BETA HCG BLOOD, ED ([facility], [facility], [facility] ONLY)    Imaging Review Ct Abdomen Pelvis W Contrast  09/18/2015  CLINICAL DATA:  45 year old female with right lower quadrant abdominal pain EXAM: CT ABDOMEN AND PELVIS  WITH CONTRAST TECHNIQUE: Multidetector CT imaging of the abdomen and pelvis was performed using the standard protocol following bolus administration of intravenous contrast. CONTRAST:  143mL ISOVUE-300 IOPAMIDOL (ISOVUE-300) INJECTION 61% COMPARISON:  CT dated 09/02/2008 FINDINGS: This the visualized lung bases are clear. 60 no intra-abdominal free air or free fluid. This is the liver, gallbladder, pancreas, spleen, and the right  adrenal gland appear unremarkable. There is linear calcification of the left adrenal gland, likely related to prior insult. This is similar to prior study. The kidneys, visualized ureters, and urinary bladder appear unremarkable. Hysterectomy. There is a 3.8 cm left ovarian dominant follicle/ cyst. Ultrasound may provide better evaluation of the pelvic structures. There is no evidence of bowel obstruction. The appendix is inflamed. There is stone is at the base of the appendix. There is stranding and small amount of inflammatory fluid surrounding the appendix. The appendix is located in the right lower quadrant inferior to the cecum. There is no drainable fluid collection or abscess. The abdominal aorta and IVC appear unremarkable. No portal venous gas identified. There is no adenopathy. There is a diastases of anterior abdominal wall musculature in the midline with a small fat containing umbilical hernia. Bilateral breast implants partially visualized. The osseous structures are intact. IMPRESSION: Acute appendicitis.  No abscess. Electronically Signed   By: Anner Crete M.D.   On: 09/18/2015 21:32   I have personally reviewed and evaluated these images and lab results as part of my medical decision-making.   EKG Interpretation None      MDM   Final diagnoses:  Acute appendicitis with localized peritonitis     With acute uncomplicated appendicitis on CT. Leukocytosis of 15, afebrile initially tachycardic but resolves with IVF. Started on ceftriaxone and flagyl. Made NPO and on continuous IVF. Discussed with Dr. Georgette Dover who will admit for ongoing management.     Forde Dandy, MD 09/18/15 2222

## 2015-09-18 NOTE — ED Notes (Signed)
Rt upper abd to the lower abd on the rt nauseated no vomiting no diarrhea  She has had this for 2 days  Unable to void at present weakness ,   lmp none hyst

## 2015-09-18 NOTE — ED Notes (Signed)
Pt complaining of welts and hives on right arm after antibiotic administration, this RN stopped the antibiotic.

## 2015-09-18 NOTE — ED Notes (Signed)
Pt itching on the rt forearm  After getting morphine iv.  Rash only on nthe forearm  No systemic rash no sob .  Rocephin was stopped momentarily  But restarted no further rash no irching rash gone in 15 minutes.

## 2015-09-19 ENCOUNTER — Observation Stay (HOSPITAL_COMMUNITY): Payer: BLUE CROSS/BLUE SHIELD | Admitting: Certified Registered"

## 2015-09-19 ENCOUNTER — Encounter (HOSPITAL_COMMUNITY)
Admission: EM | Disposition: A | Discharge status: Home / Self Care | Payer: Self-pay | Source: Home / Self Care | Attending: Emergency Medicine

## 2015-09-19 HISTORY — PX: LAPAROSCOPIC APPENDECTOMY: SHX408

## 2015-09-19 LAB — URINALYSIS, ROUTINE W REFLEX MICROSCOPIC
BILIRUBIN URINE: NEGATIVE
GLUCOSE, UA: NEGATIVE mg/dL
HGB URINE DIPSTICK: NEGATIVE
KETONES UR: 15 mg/dL — AB
Leukocytes, UA: NEGATIVE
NITRITE: NEGATIVE
PH: 6 (ref 5.0–8.0)
Protein, ur: NEGATIVE mg/dL

## 2015-09-19 LAB — CBC
HEMATOCRIT: 38.6 % (ref 36.0–46.0)
Hemoglobin: 12.8 g/dL (ref 12.0–15.0)
MCH: 28.6 pg (ref 26.0–34.0)
MCHC: 33.2 g/dL (ref 30.0–36.0)
MCV: 86.2 fL (ref 78.0–100.0)
PLATELETS: 199 10*3/uL (ref 150–400)
RBC: 4.48 MIL/uL (ref 3.87–5.11)
RDW: 12.7 % (ref 11.5–15.5)
WBC: 11.6 10*3/uL — AB (ref 4.0–10.5)

## 2015-09-19 LAB — CREATININE, SERUM
Creatinine, Ser: 0.7 mg/dL (ref 0.44–1.00)
GFR calc Af Amer: >60 mL/min (ref >60)
GFR calc non Af Amer: >60 mL/min (ref >60)

## 2015-09-19 LAB — SURGICAL PCR SCREEN
MRSA, PCR: NEGATIVE
Staphylococcus aureus: POSITIVE — AB

## 2015-09-19 SURGERY — APPENDECTOMY, LAPAROSCOPIC
Anesthesia: General | Site: Abdomen

## 2015-09-19 MED ORDER — OXYCODONE HCL 5 MG PO TABS
5.0000 mg | ORAL_TABLET | ORAL | Status: DC | PRN
  Administered 2015-09-19 – 2015-09-20 (×3): 10 mg via ORAL
  Filled 2015-09-19 (×3): qty 2

## 2015-09-19 MED ORDER — PHENYLEPHRINE 40 MCG/ML (10ML) SYRINGE FOR IV PUSH (FOR BLOOD PRESSURE SUPPORT)
PREFILLED_SYRINGE | INTRAVENOUS | Status: AC
  Filled 2015-09-19: qty 10

## 2015-09-19 MED ORDER — ENOXAPARIN SODIUM 40 MG/0.4ML ~~LOC~~ SOLN
40.0000 mg | Freq: Every day | SUBCUTANEOUS | Status: DC
  Administered 2015-09-20: 40 mg via SUBCUTANEOUS
  Filled 2015-09-19: qty 0.4

## 2015-09-19 MED ORDER — MUPIROCIN 2 % EX OINT
TOPICAL_OINTMENT | Freq: Two times a day (BID) | CUTANEOUS | Status: DC
  Administered 2015-09-19: 23:00:00 via NASAL
  Filled 2015-09-19: qty 22

## 2015-09-19 MED ORDER — ONDANSETRON HCL 4 MG/2ML IJ SOLN
INTRAMUSCULAR | Status: AC
  Filled 2015-09-19: qty 2

## 2015-09-19 MED ORDER — ONDANSETRON 4 MG PO TBDP: 4.0000 mg | ORAL_TABLET | Freq: Four times a day (QID) | ORAL | Status: DC | PRN

## 2015-09-19 MED ORDER — ROCURONIUM BROMIDE 50 MG/5ML IV SOLN
INTRAVENOUS | Status: AC
  Filled 2015-09-19: qty 1

## 2015-09-19 MED ORDER — SUGAMMADEX SODIUM 200 MG/2ML IV SOLN
INTRAVENOUS | Status: DC | PRN
  Administered 2015-09-19: 227 mg via INTRAVENOUS

## 2015-09-19 MED ORDER — LIDOCAINE HCL 1 % IJ SOLN
INTRAMUSCULAR | Status: DC | PRN
  Administered 2015-09-19: 8 mL

## 2015-09-19 MED ORDER — MIDAZOLAM HCL 5 MG/5ML IJ SOLN
INTRAMUSCULAR | Status: DC | PRN
  Administered 2015-09-19: 1 mg via INTRAVENOUS

## 2015-09-19 MED ORDER — SUCCINYLCHOLINE CHLORIDE 20 MG/ML IJ SOLN
INTRAMUSCULAR | Status: DC | PRN
  Administered 2015-09-19: 120 mg via INTRAVENOUS

## 2015-09-19 MED ORDER — FENTANYL CITRATE (PF) 100 MCG/2ML IJ SOLN
INTRAMUSCULAR | Status: DC | PRN
  Administered 2015-09-19 (×2): 50 ug via INTRAVENOUS
  Administered 2015-09-19: 100 ug via INTRAVENOUS

## 2015-09-19 MED ORDER — LACTATED RINGERS IV SOLN
INTRAVENOUS | Status: DC
  Administered 2015-09-19 (×3): via INTRAVENOUS

## 2015-09-19 MED ORDER — LIDOCAINE HCL (PF) 1 % IJ SOLN
INTRAMUSCULAR | Status: AC
  Filled 2015-09-19: qty 30

## 2015-09-19 MED ORDER — TOPIRAMATE 25 MG PO TABS
50.0000 mg | ORAL_TABLET | Freq: Two times a day (BID) | ORAL | Status: DC
Start: 2015-09-19 — End: 2015-09-20
  Administered 2015-09-19 – 2015-09-20 (×2): 50 mg via ORAL
  Filled 2015-09-19 (×4): qty 2

## 2015-09-19 MED ORDER — FENTANYL CITRATE (PF) 250 MCG/5ML IJ SOLN
INTRAMUSCULAR | Status: AC
  Filled 2015-09-19: qty 5

## 2015-09-19 MED ORDER — HYDROMORPHONE HCL 1 MG/ML IJ SOLN
1.0000 mg | INTRAMUSCULAR | Status: DC | PRN
  Administered 2015-09-19 (×6): 1 mg via INTRAVENOUS
  Filled 2015-09-19 (×6): qty 1

## 2015-09-19 MED ORDER — PROMETHAZINE HCL 25 MG/ML IJ SOLN: 6.2500 mg | INTRAMUSCULAR | Status: DC | PRN

## 2015-09-19 MED ORDER — METHYLPREDNISOLONE SODIUM SUCC 125 MG IJ SOLR
60.0000 mg | Freq: Once | INTRAMUSCULAR | Status: AC
  Administered 2015-09-19: 60 mg via INTRAVENOUS
  Filled 2015-09-19: qty 2

## 2015-09-19 MED ORDER — MIDAZOLAM HCL 2 MG/2ML IJ SOLN
INTRAMUSCULAR | Status: AC
  Filled 2015-09-19: qty 2

## 2015-09-19 MED ORDER — BUPIVACAINE-EPINEPHRINE (PF) 0.25% -1:200000 IJ SOLN
INTRAMUSCULAR | Status: AC
  Filled 2015-09-19: qty 30

## 2015-09-19 MED ORDER — SIMETHICONE 80 MG PO CHEW
80.0000 mg | CHEWABLE_TABLET | Freq: Four times a day (QID) | ORAL | Status: DC | PRN
  Administered 2015-09-19 – 2015-09-20 (×2): 80 mg via ORAL
  Filled 2015-09-19 (×2): qty 1

## 2015-09-19 MED ORDER — FLUOXETINE HCL 20 MG PO CAPS
20.0000 mg | ORAL_CAPSULE | Freq: Every day | ORAL | Status: DC
  Administered 2015-09-19 – 2015-09-20 (×2): 20 mg via ORAL
  Filled 2015-09-19 (×2): qty 1

## 2015-09-19 MED ORDER — PROPOFOL 10 MG/ML IV BOLUS
INTRAVENOUS | Status: AC
  Filled 2015-09-19: qty 20

## 2015-09-19 MED ORDER — CIPROFLOXACIN IN D5W 400 MG/200ML IV SOLN
400.0000 mg | Freq: Two times a day (BID) | INTRAVENOUS | Status: DC
  Administered 2015-09-19 – 2015-09-20 (×4): 400 mg via INTRAVENOUS
  Filled 2015-09-19 (×5): qty 200

## 2015-09-19 MED ORDER — ONDANSETRON HCL 4 MG/2ML IJ SOLN
4.0000 mg | Freq: Four times a day (QID) | INTRAMUSCULAR | Status: DC | PRN
  Administered 2015-09-19: 4 mg via INTRAVENOUS

## 2015-09-19 MED ORDER — ROCURONIUM BROMIDE 100 MG/10ML IV SOLN
INTRAVENOUS | Status: DC | PRN
  Administered 2015-09-19: 40 mg via INTRAVENOUS

## 2015-09-19 MED ORDER — DEXAMETHASONE SODIUM PHOSPHATE 10 MG/ML IJ SOLN
INTRAMUSCULAR | Status: AC
  Filled 2015-09-19: qty 1

## 2015-09-19 MED ORDER — LIDOCAINE HCL (CARDIAC) 20 MG/ML IV SOLN
INTRAVENOUS | Status: DC | PRN
  Administered 2015-09-19: 100 mg via INTRAVENOUS

## 2015-09-19 MED ORDER — PROPOFOL 10 MG/ML IV BOLUS
INTRAVENOUS | Status: DC | PRN
  Administered 2015-09-19: 200 mg via INTRAVENOUS

## 2015-09-19 MED ORDER — SODIUM CHLORIDE 0.9 % IR SOLN
Status: DC | PRN
  Administered 2015-09-19: 1000 mL

## 2015-09-19 MED ORDER — DEXAMETHASONE SODIUM PHOSPHATE 10 MG/ML IJ SOLN
INTRAMUSCULAR | Status: DC | PRN
  Administered 2015-09-19: 10 mg via INTRAVENOUS

## 2015-09-19 MED ORDER — METRONIDAZOLE IN NACL 5-0.79 MG/ML-% IV SOLN
500.0000 mg | INTRAVENOUS | Status: AC
  Administered 2015-09-19: 500 mg via INTRAVENOUS
  Filled 2015-09-19: qty 100

## 2015-09-19 MED ORDER — DIPHENHYDRAMINE HCL 25 MG PO CAPS
25.0000 mg | ORAL_CAPSULE | Freq: Four times a day (QID) | ORAL | Status: DC | PRN
  Administered 2015-09-20: 25 mg via ORAL
  Filled 2015-09-19: qty 1

## 2015-09-19 MED ORDER — GLYCOPYRROLATE 0.2 MG/ML IJ SOLN
INTRAMUSCULAR | Status: DC | PRN
  Administered 2015-09-19: 0.1 mg via INTRAVENOUS

## 2015-09-19 MED ORDER — 0.9 % SODIUM CHLORIDE (POUR BTL) OPTIME
TOPICAL | Status: DC | PRN
  Administered 2015-09-19: 1000 mL

## 2015-09-19 MED ORDER — FENTANYL CITRATE (PF) 100 MCG/2ML IJ SOLN: 25.0000 ug | INTRAMUSCULAR | Status: DC | PRN

## 2015-09-19 MED ORDER — METRONIDAZOLE IN NACL 5-0.79 MG/ML-% IV SOLN
500.0000 mg | Freq: Three times a day (TID) | INTRAVENOUS | Status: DC
  Administered 2015-09-19 – 2015-09-20 (×4): 500 mg via INTRAVENOUS
  Filled 2015-09-19 (×7): qty 100

## 2015-09-19 MED ORDER — DIPHENHYDRAMINE HCL 50 MG/ML IJ SOLN
25.0000 mg | Freq: Four times a day (QID) | INTRAMUSCULAR | Status: DC | PRN
  Administered 2015-09-19 (×2): 25 mg via INTRAVENOUS
  Filled 2015-09-19 (×2): qty 1

## 2015-09-19 SURGICAL SUPPLY — 52 items
ADH SKN CLS APL DERMABOND .7 (GAUZE/BANDAGES/DRESSINGS) ×1
APPLIER CLIP ROT 10 11.4 M/L (STAPLE)
APR CLP MED LRG 11.4X10 (STAPLE)
BAG SPEC RTRVL LRG 6X4 10 (ENDOMECHANICALS) ×1
BLADE SURG ROTATE 9660 (MISCELLANEOUS) IMPLANT
CANISTER SUCTION 2500CC (MISCELLANEOUS) ×3 IMPLANT
CHLORAPREP W/TINT 26ML (MISCELLANEOUS) ×3 IMPLANT
CLIP APPLIE ROT 10 11.4 M/L (STAPLE) IMPLANT
COVER SURGICAL LIGHT HANDLE (MISCELLANEOUS) ×3 IMPLANT
CUTTER FLEX LINEAR 45M (STAPLE) ×3 IMPLANT
DERMABOND ADVANCED (GAUZE/BANDAGES/DRESSINGS) ×2
DERMABOND ADVANCED .7 DNX12 (GAUZE/BANDAGES/DRESSINGS) IMPLANT
DRAPE WARM FLUID 44X44 (DRAPE) ×3 IMPLANT
ELECT REM PT RETURN 9FT ADLT (ELECTROSURGICAL) ×3
ELECTRODE REM PT RTRN 9FT ADLT (ELECTROSURGICAL) ×1 IMPLANT
ENDOLOOP SUT PDS II  0 18 (SUTURE)
ENDOLOOP SUT PDS II 0 18 (SUTURE) IMPLANT
GLOVE BIO SURGEON STRL SZ 6 (GLOVE) ×3 IMPLANT
GLOVE BIO SURGEON STRL SZ 6.5 (GLOVE) ×2 IMPLANT
GLOVE BIO SURGEON STRL SZ7 (GLOVE) ×2 IMPLANT
GLOVE BIO SURGEON STRL SZ7.5 (GLOVE) ×2 IMPLANT
GLOVE BIO SURGEONS STRL SZ 6.5 (GLOVE) ×2
GLOVE BIOGEL PI IND STRL 6.5 (GLOVE) ×1 IMPLANT
GLOVE BIOGEL PI IND STRL 7.0 (GLOVE) IMPLANT
GLOVE BIOGEL PI IND STRL 7.5 (GLOVE) IMPLANT
GLOVE BIOGEL PI INDICATOR 6.5 (GLOVE) ×2
GLOVE BIOGEL PI INDICATOR 7.0 (GLOVE) ×4
GLOVE BIOGEL PI INDICATOR 7.5 (GLOVE) ×2
GLOVE SURG SS PI 7.0 STRL IVOR (GLOVE) ×2 IMPLANT
GOWN STRL REUS W/ TWL LRG LVL3 (GOWN DISPOSABLE) ×2 IMPLANT
GOWN STRL REUS W/TWL 2XL LVL3 (GOWN DISPOSABLE) ×3 IMPLANT
GOWN STRL REUS W/TWL LRG LVL3 (GOWN DISPOSABLE) ×6
KIT BASIN OR (CUSTOM PROCEDURE TRAY) ×3 IMPLANT
KIT ROOM TURNOVER OR (KITS) ×3 IMPLANT
LIQUID BAND (GAUZE/BANDAGES/DRESSINGS) ×3 IMPLANT
NS IRRIG 1000ML POUR BTL (IV SOLUTION) ×3 IMPLANT
PAD ARMBOARD 7.5X6 YLW CONV (MISCELLANEOUS) ×6 IMPLANT
POUCH SPECIMEN RETRIEVAL 10MM (ENDOMECHANICALS) ×3 IMPLANT
RELOAD STAPLE 45 3.5 BLU ETS (ENDOMECHANICALS) ×1 IMPLANT
RELOAD STAPLE TA45 3.5 REG BLU (ENDOMECHANICALS) ×3 IMPLANT
SCALPEL HARMONIC ACE (MISCELLANEOUS) ×3 IMPLANT
SET IRRIG TUBING LAPAROSCOPIC (IRRIGATION / IRRIGATOR) ×3 IMPLANT
SLEEVE ENDOPATH XCEL 5M (ENDOMECHANICALS) ×3 IMPLANT
SPECIMEN JAR SMALL (MISCELLANEOUS) ×3 IMPLANT
SUT MNCRL AB 4-0 PS2 18 (SUTURE) ×3 IMPLANT
TOWEL OR 17X24 6PK STRL BLUE (TOWEL DISPOSABLE) ×3 IMPLANT
TOWEL OR 17X26 10 PK STRL BLUE (TOWEL DISPOSABLE) ×3 IMPLANT
TRAY FOLEY CATH 16FR SILVER (SET/KITS/TRAYS/PACK) ×3 IMPLANT
TRAY LAPAROSCOPIC MC (CUSTOM PROCEDURE TRAY) ×3 IMPLANT
TROCAR XCEL BLUNT TIP 100MML (ENDOMECHANICALS) ×3 IMPLANT
TROCAR XCEL NON-BLD 5MMX100MML (ENDOMECHANICALS) ×3 IMPLANT
TUBING INSUFFLATION (TUBING) ×3 IMPLANT

## 2015-09-19 NOTE — Anesthesia Procedure Notes (Signed)
Procedure Name: Intubation Date/Time: 09/19/2015 9:59 AM Performed by: Lavell Luster Pre-anesthesia Checklist: Patient identified, Emergency Drugs available, Suction available, Patient being monitored and Timeout performed Patient Re-evaluated:Patient Re-evaluated prior to inductionOxygen Delivery Method: Circle system utilized Preoxygenation: Pre-oxygenation with 100% oxygen Intubation Type: IV induction Ventilation: Mask ventilation without difficulty Laryngoscope Size: Mac and 3 Grade View: Grade I Tube size: 7.5 mm Number of attempts: 1 Airway Equipment and Method: Stylet Placement Confirmation: ETT inserted through vocal cords under direct vision,  positive ETCO2 and breath sounds checked- equal and bilateral Secured at: 21 cm Tube secured with: Tape Dental Injury: Teeth and Oropharynx as per pre-operative assessment

## 2015-09-19 NOTE — Anesthesia Postprocedure Evaluation (Signed)
Anesthesia Post Note  Patient: Dawn Burton  Procedure(s) Performed: Procedure(s) (LRB): LAPAROSCOPIC APPENDECTOMY (N/A)  Patient location during evaluation: PACU Anesthesia Type: General Level of consciousness: awake and alert Pain management: pain level controlled Vital Signs Assessment: post-procedure vital signs reviewed and stable Respiratory status: spontaneous breathing, nonlabored ventilation, respiratory function stable and patient connected to nasal cannula oxygen Cardiovascular status: blood pressure returned to baseline and stable Postop Assessment: no signs of nausea or vomiting Anesthetic complications: no    Last Vitals:  Filed Vitals:   09/19/15 1217 09/19/15 1411  BP: 131/65 110/57  Pulse: 55 54  Temp: 37 C 36.7 C  Resp: 15 16    Last Pain:  Filed Vitals:   09/19/15 1425  PainSc: Asleep                 Catalina Gravel

## 2015-09-19 NOTE — Op Note (Signed)
Appendectomy, Lap, Procedure Note  Indications: The patient presented with a history of right-sided abdominal pain. A CT revealed findings consistent with acute appendicitis.  Pre-operative Diagnosis: Acute appendicitis with localized peritonitis  Post-operative Diagnosis: Same  Surgeon: Stark Klein   Assistants: Sharyn Dross, RNFA  Anesthesia: General endotracheal anesthesia and Local anesthesia 1% plain lidocaine, 0.25.% bupivacaine, with epinephrine  ASA Class: 2 E  Procedure Details  The patient was seen again in the Holding Room. The risks, benefits, complications, treatment options, and expected outcomes were discussed with the patient and/or family. The possibilities of perforation of viscus, bleeding, recurrent infection, the need for additional procedures, failure to diagnose a condition, and creating a complication requiring transfusion or operation were discussed. There was concurrence with the proposed plan and informed consent was obtained. The site of surgery was properly noted. The patient was taken to Operating Room, identified as Dawn Burton and the procedure verified as Appendectomy. A Time Out was held and the above information confirmed.  The patient was placed in the supine position and general anesthesia was induced, along with placement of orogastric tube, Venodyne boots, and a Foley catheter. The abdomen was prepped and draped in a sterile fashion. Local anesthetic was infiltrated in the infraumbilical region.  A 1.5 cm curvilinear transverse incision was made just below the umbilicus.  The Kelly clamp was used to spread the subcutaneous tissues.  The fascia was elevated with 2 Kocher clamps and incised with the #11 blade.  A Claiborne Billings was used to confirm entrance into the peritoneal cavity.  A pursestring suture was placed around the fascial incision.  The Hasson trocar was inserted into the abdomen and held in place with the tails of the suture.  The  pneumoperitoneum was then established to steady pressure of 15 mmHg.     Additional 5 mm cannulas then placed in the left lower quadrant of the abdomen and the suprapubic region under direct visualization.  A careful evaluation of the entire abdomen was carried out. The patient was placed in Trendelenburg and rotated to the left.  The small intestines were retracted in the cephalad and left lateral direction away from the pelvis and right lower quadrant. The patient was found to have an enlarged and inflamed appendix that was posterior to the cecum There was no evidence of perforation.  The appendix was carefully dissected. The appendix was was skeletonized with the harmonic scalpel.   The appendix was divided at its base using an endo-GIA stapler. Minimal appendiceal stump was left in place. The appendix was removed from the abdomen with an Endocatch bag through the left subcostal port.  There was no evidence of bleeding, leakage, or complication after division of the appendix. Irrigation was also performed and irrigate suctioned from the abdomen as well.  The 5 mm trocars were removed.  The pneumoperitoneum was evacuated from the abdomen.    The trocar site skin wounds were closed with 4-0 Monocryl and dressed with Dermabond.  Instrument, sponge, and needle counts were correct at the conclusion of the case.   Findings: The appendix was found to be inflamed. There were not signs of necrosis.  There was not perforation. There was not abscess formation.  Estimated Blood Loss:  less than 50 mL         Specimens: appendix         Complications:  None; patient tolerated the procedure well.         Disposition: PACU - hemodynamically stable.  Condition: stable

## 2015-09-19 NOTE — Anesthesia Preprocedure Evaluation (Addendum)
Anesthesia Evaluation  Patient identified by MRN, date of birth, ID band Patient awake    Reviewed: Allergy & Precautions, NPO status , Patient's Chart, lab work & pertinent test results  History of Anesthesia Complications Negative for: history of anesthetic complications  Airway Mallampati: III  TM Distance: >3 FB Neck ROM: Full    Dental  (+) Teeth Intact, Dental Advisory Given, Missing   Pulmonary neg pulmonary ROS,    Pulmonary exam normal breath sounds clear to auscultation       Cardiovascular Normal cardiovascular exam+ dysrhythmias (s/p ablation) Supra Ventricular Tachycardia + Valvular Problems/Murmurs MVP  Rhythm:Regular Rate:Normal     Neuro/Psych  Headaches,    GI/Hepatic negative GI ROS, Neg liver ROS, Appendicitis; +nausea    Endo/Other  negative endocrine ROS  Renal/GU negative Renal ROS     Musculoskeletal negative musculoskeletal ROS (+)   Abdominal (+)  Abdomen: soft. Bowel sounds: normal.  Peds  Hematology negative hematology ROS (+)   Anesthesia Other Findings Day of surgery medications reviewed with the patient.  Reproductive/Obstetrics negative OB ROS                           Anesthesia Physical Anesthesia Plan  ASA: II and emergent  Anesthesia Plan: General   Post-op Pain Management:    Induction: Intravenous  Airway Management Planned: Oral ETT  Additional Equipment:   Intra-op Plan:   Post-operative Plan: Extubation in OR  Informed Consent: I have reviewed the patients History and Physical, chart, labs and discussed the procedure including the risks, benefits and alternatives for the proposed anesthesia with the patient or authorized representative who has indicated his/her understanding and acceptance.   Dental advisory given  Plan Discussed with: CRNA  Anesthesia Plan Comments: (Risks/benefits of general anesthesia discussed with patient  including risk of damage to teeth, lips, gum, and tongue, nausea/vomiting, allergic reactions to medications, and the possibility of heart attack, stroke and death.  All patient questions answered.  Patient wishes to proceed.)        Anesthesia Quick Evaluation

## 2015-09-19 NOTE — Interval H&P Note (Signed)
History and Physical Interval Note:  09/19/2015 9:39 AM  Dawn Burton  has presented today for surgery, with the diagnosis of ACUTE APPENDICITIS  The various methods of treatment have been discussed with the patient and family. After consideration of risks, benefits and other options for treatment, the patient has consented to  Procedure(s): LAPAROSCOPIC APPENDECTOMY (N/A) as a surgical intervention .  The patient's history has been reviewed, patient examined, no change in status, stable for surgery.  I have reviewed the patient's chart and labs.  Questions were answered to the patient's satisfaction.     Dawn Burton

## 2015-09-19 NOTE — Progress Notes (Signed)
Pt arrived to 6n18 from PACU, denies nausea/pain at this time, husband at beside

## 2015-09-19 NOTE — Transfer of Care (Signed)
Immediate Anesthesia Transfer of Care Note  Patient: Dawn Burton  Procedure(s) Performed: Procedure(s): LAPAROSCOPIC APPENDECTOMY (N/A)  Patient Location: PACU  Anesthesia Type:General  Level of Consciousness: awake, alert  and oriented  Airway & Oxygen Therapy: Patient connected to face mask oxygen  Post-op Assessment: Post -op Vital signs reviewed and stable  Post vital signs: stable  Last Vitals:  Filed Vitals:   09/19/15 0821 09/19/15 1105  BP: 96/40   Pulse: 55   Temp:  36.8 C  Resp: 17     Last Pain:  Filed Vitals:   09/19/15 1108  PainSc: 4       Patients Stated Pain Goal: 2 ([phone])  Complications: No apparent anesthesia complications

## 2015-09-20 ENCOUNTER — Encounter (HOSPITAL_COMMUNITY): Payer: Self-pay | Admitting: General Surgery

## 2015-09-20 MED ORDER — OXYCODONE-ACETAMINOPHEN 5-325 MG PO TABS: 1.0000 | ORAL_TABLET | ORAL | Status: DC | PRN

## 2015-09-20 MED ORDER — METHYLPREDNISOLONE 4 MG PO TBPK: ORAL_TABLET | ORAL | Status: DC

## 2015-09-20 NOTE — Progress Notes (Signed)
AVS given to patient. IVs removed. Transportation arranged by patient. Belongings packed. Understanding demonstrated.

## 2015-09-20 NOTE — Discharge Summary (Signed)
Physician Discharge Summary  Patient ID: Dawn Burton MRN: [id number] DOB/AGE: 1970/04/07 45 y.o.  Admit date: 09/18/2015 Discharge date: 09/20/2015  Admitting Diagnosis: Acute appendicitis  Discharge Diagnosis Patient Active Problem List   Diagnosis Date Noted  . Acute appendicitis 09/18/2015    Consultants none  Imaging: Ct Abdomen Pelvis W Contrast  09/18/2015  CLINICAL DATA:  45 year old female with right lower quadrant abdominal pain EXAM: CT ABDOMEN AND PELVIS WITH CONTRAST TECHNIQUE: Multidetector CT imaging of the abdomen and pelvis was performed using the standard protocol following bolus administration of intravenous contrast. CONTRAST:  158mL ISOVUE-300 IOPAMIDOL (ISOVUE-300) INJECTION 61% COMPARISON:  CT dated 09/02/2008 FINDINGS: This the visualized lung bases are clear. 60 no intra-abdominal free air or free fluid. This is the liver, gallbladder, pancreas, spleen, and the right adrenal gland appear unremarkable. There is linear calcification of the left adrenal gland, likely related to prior insult. This is similar to prior study. The kidneys, visualized ureters, and urinary bladder appear unremarkable. Hysterectomy. There is a 3.8 cm left ovarian dominant follicle/ cyst. Ultrasound may provide better evaluation of the pelvic structures. There is no evidence of bowel obstruction. The appendix is inflamed. There is stone is at the base of the appendix. There is stranding and small amount of inflammatory fluid surrounding the appendix. The appendix is located in the right lower quadrant inferior to the cecum. There is no drainable fluid collection or abscess. The abdominal aorta and IVC appear unremarkable. No portal venous gas identified. There is no adenopathy. There is a diastases of anterior abdominal wall musculature in the midline with a small fat containing umbilical hernia. Bilateral breast implants partially visualized. The osseous structures are intact. IMPRESSION:  Acute appendicitis.  No abscess. Electronically Signed   By: Anner Crete M.D.   On: 09/18/2015 21:32    Procedures Laparoscopic appendectomy---Dr. Haydee Monica Course:  Dawn Burton presented to [facility] with RLQ abdominal pain.  Workup showed acute appendicitis.  Patient was admitted and underwent procedure listed above.  Tolerated procedure well and was transferred to the floor.  Diet was advanced as tolerated.  On POD#1, the patient was voiding well, tolerating diet, ambulating well, pain well controlled, vital signs stable, incisions c/d/i and felt stable for discharge home.  Medication risks, benefits and therapeutic alternatives were reviewed with the patient.  She verbalizes understanding.  Patient will follow up in our office in 3 weeks and knows to call with questions or concerns.  Physical Exam: General:  Alert, NAD, pleasant, comfortable Abd:  Soft, ND, mild tenderness, incisions C/D/I    Medication List    STOP taking these medications        meloxicam 15 MG tablet  Commonly known as:  MOBIC      TAKE these medications        ALPRAZolam 0.25 MG tablet  Commonly known as:  XANAX  Take 0.25 mg by mouth daily as needed. anxiety     FLUoxetine 20 MG capsule  Commonly known as:  PROZAC  Take 20 mg by mouth daily.     methylPREDNISolone 4 MG Tbpk tablet  Commonly known as:  MEDROL DOSEPAK  Per package insert     oxyCODONE-acetaminophen 5-325 MG tablet  Commonly known as:  ROXICET  Take 1 tablet by mouth every 4 (four) hours as needed for severe pain.     phentermine 30 MG capsule  Take 30 mg by mouth daily.  Follow-up Information    Follow up with [facility] 10/05/2015.   Specialty:  General Surgery   Why:  arrive by 9:15AM for a 9:45AM post op check   Contact information:   [address] [place] [address] [phone]       Signed: Erby Pian, [facility]  Surgery [phone]  09/20/2015, 9:58 AM

## 2015-09-20 NOTE — Discharge Instructions (Signed)
LAPAROSCOPIC SURGERY: POST OP INSTRUCTIONS ° °1. DIET: Follow a light bland diet the first 24 hours after arrival home, such as soup, liquids, crackers, etc.  Be sure to include lots of fluids daily.  Avoid fast food or heavy meals as your are more likely to get nauseated.  Eat a low fat the next few days after surgery.   °2. Take your usually prescribed home medications unless otherwise directed. °3. PAIN CONTROL: °a. Pain is best controlled by a usual combination of three different methods TOGETHER: °i. Ice/Heat °ii. Over the counter pain medication °iii. Prescription pain medication °b. Most patients will experience some swelling and bruising around the incisions.  Ice packs or heating pads (30-60 minutes up to 6 times a day) will help. Use ice for the first few days to help decrease swelling and bruising, then switch to heat to help relax tight/sore spots and speed recovery.  Some people prefer to use ice alone, heat alone, alternating between ice & heat.  Experiment to what works for you.  Swelling and bruising can take several weeks to resolve.   °c. It is helpful to take an over-the-counter pain medication regularly for the first few weeks.  Choose one of the following that works best for you: °i. Naproxen (Aleve, etc)  Two 220mg tabs twice a day °ii. Ibuprofen (Advil, etc) Three 200mg tabs four times a day (every meal & bedtime) °iii. Acetaminophen (Tylenol, etc) 500-650mg four times a day (every meal & bedtime) °d. A  prescription for pain medication (such as oxycodone, hydrocodone, etc) should be given to you upon discharge.  Take your pain medication as prescribed.  °i. If you are having problems/concerns with the prescription medicine (does not control pain, nausea, vomiting, rash, itching, etc), please call us [phone] to see if we need to switch you to a different pain medicine that will work better for you and/or control your side effect better. °ii. If you need a refill on your pain medication,  please contact your pharmacy.  They will contact our office to request authorization. Prescriptions will not be filled after 5 pm or on week-ends. °4. Avoid getting constipated.  Between the surgery and the pain medications, it is common to experience some constipation.  Increasing fluid intake and taking a fiber supplement (such as Metamucil, Citrucel, FiberCon, MiraLax, etc) 1-2 times a day regularly will usually help prevent this problem from occurring.  A mild laxative (prune juice, Milk of Magnesia, MiraLax, etc) should be taken according to package directions if there are no bowel movements after 48 hours.   °5. Watch out for diarrhea.  If you have many loose bowel movements, simplify your diet to bland foods & liquids for a few days.  Stop any stool softeners and decrease your fiber supplement.  Switching to mild anti-diarrheal medications (Kayopectate, Pepto Bismol) can help.  If this worsens or does not improve, please call us. °6. Wash / shower every day.  You may shower over the dressings as they are waterproof.  Continue to shower over incision(s) after the dressing is off. °7. Remove your waterproof bandages 5 days after surgery.  You may leave the incision open to air.  You may replace a dressing/Band-Aid to cover the incision for comfort if you wish.  °8. ACTIVITIES as tolerated:   °a. You may resume regular (light) daily activities beginning the next day--such as daily self-care, walking, climbing stairs--gradually increasing activities as tolerated.  If you can walk 30 minutes without difficulty, it   is safe to try more intense activity such as jogging, treadmill, bicycling, low-impact aerobics, swimming, etc. b. Save the most intensive and strenuous activity for last such as sit-ups, heavy lifting, contact sports, etc  Refrain from any heavy lifting or straining until you are off narcotics for pain control.   c. DO NOT PUSH THROUGH PAIN.  Let pain be your guide: If it hurts to do something, don't  do it.  Pain is your body warning you to avoid that activity for another week until the pain goes down. d. You may drive when you are no longer taking prescription pain medication, you can comfortably wear a seatbelt, and you can safely maneuver your car and apply brakes. e. Dennis Bast may have sexual intercourse when it is comfortable.  9. FOLLOW UP in our office a. Please call [facility] at [phone] to set up an appointment to see your surgeon in the office for a follow-up appointment approximately 2-3 weeks after your surgery. b. Make sure that you call for this appointment the day you arrive home to insure a convenient appointment time. 10. IF YOU HAVE DISABILITY OR FAMILY LEAVE FORMS, BRING THEM TO THE OFFICE FOR PROCESSING.  DO NOT GIVE THEM TO YOUR DOCTOR.   WHEN TO CALL us [phone]: 1. Poor pain control 2. Reactions / problems with new medications (rash/itching, nausea, etc)  3. Fever over 101.5 F (38.5 C) 4. Inability to urinate 5. Nausea and/or vomiting 6. Worsening swelling or bruising 7. Continued bleeding from incision. 8. Increased pain, redness, or drainage from the incision   The clinic staff is available to answer your questions during regular business hours (8:30am-5pm).  Please dont hesitate to call and ask to speak to one of our nurses for clinical concerns.   If you have a medical emergency, go to the nearest emergency room or call 911.  A surgeon from [facility] is always on call at the [facility], [place], [place], [place],   [address] ? MAIN: [phone] ? TOLL FREE: [phone] ?  FAX [phone] [email or web address]  Methylprednisolone tablets What is this medicine? METHYLPREDNISOLONE (meth ill pred NISS oh lone) is a corticosteroid. It is commonly used to treat inflammation of the skin, joints, lungs, and other organs. Common conditions treated include asthma, allergies, and  arthritis. It is also used for other conditions, such as blood disorders and diseases of the adrenal glands. This medicine may be used for other purposes; ask your health care provider or pharmacist if you have questions. What should I tell my health care provider before I take this medicine? They need to know if you have any of these conditions: -Cushing's syndrome -diabetes -glaucoma -heart problems or disease -high blood pressure -infection such as herpes, measles, tuberculosis, or chickenpox -kidney disease -liver disease -mental problems -myasthenia gravis -osteoporosis -seizures -stomach ulcer or intestine disease including colitis and diverticulitis -thyroid problem -an unusual or allergic reaction to lactose, methylprednisolone, other medicines, foods, dyes, or preservatives -pregnant or trying to get pregnant -breast-feeding How should I use this medicine? Take this medicine by mouth with a drink of water. Follow the directions on the prescription label. Take it with food or milk to avoid stomach upset. If you are taking this medicine once a day, take it in the morning. Do not take more medicine than you are told to take. Do not suddenly stop taking your medicine because you may develop a severe reaction. Your doctor  will tell you how much medicine to take. If your doctor wants you to stop the medicine, the dose may be slowly lowered over time to avoid any side effects. Talk to your pediatrician regarding the use of this medicine in children. Special care may be needed. Overdosage: If you think you have taken too much of this medicine contact a poison control center or emergency room at once. NOTE: This medicine is only for you. Do not share this medicine with others. What if I miss a dose? If you miss a dose, take it as soon as you can. If it is almost time for your next dose, talk to your doctor or health care professional. You may need to miss a dose or take an extra dose. Do  not take double or extra doses without advice. What may interact with this medicine? Do not take this medicine with any of the following medications: -mifepristone This medicine may also interact with the following medications: -tacrolimus -vaccines -warfarin This list may not describe all possible interactions. Give your health care provider a list of all the medicines, herbs, non-prescription drugs, or dietary supplements you use. Also tell them if you smoke, drink alcohol, or use illegal drugs. Some items may interact with your medicine. What should I watch for while using this medicine? Visit your doctor or health care professional for regular checks on your progress. If you are taking this medicine for a long time, carry an identification card with your name and address, the type and dose of your medicine, and your doctor's name and address. The medicine may increase your risk of getting an infection. Stay away from people who are sick. Tell your doctor or health care professional if you are around anyone with measles or chickenpox. If you are going to have surgery, tell your doctor or health care professional that you have taken this medicine within the last twelve months. Ask your doctor or health care professional about your diet. You may need to lower the amount of salt you eat. The medicine can increase your blood sugar. If you are a diabetic check with your doctor if you need help adjusting the dose of your diabetic medicine. What side effects may I notice from receiving this medicine? Side effects that you should report to your doctor or health care professional as soon as possible: -allergic reactions like skin rash, itching or hives, swelling of the face, lips, or tongue -eye pain, decreased or blurred vision, or bulging eyes -fever, sore throat, sneezing, cough, or other signs of infection, wounds that will not heal -increased thirst -mental depression, mood swings, mistaken  feelings of self importance or of being mistreated -pain in hips, back, ribs, arms, shoulders, or legs -swelling of the ankles, feet, hands -trouble passing urine or change in the amount of urine Side effects that usually do not require medical attention (report to your doctor or health care professional if they continue or are bothersome): -confusion, excitement, restlessness -headache -nausea, vomiting -skin problems, acne, thin and shiny skin -weight gain This list may not describe all possible side effects. Call your doctor for medical advice about side effects. You may report side effects to FDA at 1-800-FDA-1088. Where should I keep my medicine? Keep out of the reach of children. Store at room temperature between 20 and 25 degrees C (68 and 77 degrees F). Throw away any unused medicine after the expiration date. NOTE: This sheet is a summary. It may not cover all possible information. If you  have questions about this medicine, talk to your doctor, pharmacist, or health care provider.    2016, Elsevier/Gold Standard. (2011-12-18 11:38:34)

## 2015-11-20 ENCOUNTER — Encounter (HOSPITAL_COMMUNITY): Payer: Self-pay | Admitting: *Deleted

## 2015-11-20 ENCOUNTER — Ambulatory Visit (INDEPENDENT_AMBULATORY_CARE_PROVIDER_SITE_OTHER): Payer: BLUE CROSS/BLUE SHIELD

## 2015-11-20 ENCOUNTER — Ambulatory Visit (HOSPITAL_COMMUNITY)
Admission: EM | Admit: 2015-11-20 | Discharge: 2015-11-20 | Disposition: A | Discharge status: Home / Self Care | Payer: BLUE CROSS/BLUE SHIELD | Attending: Internal Medicine | Admitting: Internal Medicine

## 2015-11-20 DIAGNOSIS — S93602A Unspecified sprain of left foot, initial encounter: Secondary | ICD-10-CM | POA: Diagnosis not present

## 2015-11-20 DIAGNOSIS — S93402A Sprain of unspecified ligament of left ankle, initial encounter: Secondary | ICD-10-CM

## 2015-11-20 HISTORY — DX: Major depressive disorder, single episode, unspecified: F32.9

## 2015-11-20 HISTORY — DX: Depression, unspecified: F32.A

## 2015-11-20 NOTE — ED Provider Notes (Signed)
CSN: [id number]     Arrival date & time 11/20/15  1301 History   First MD Initiated Contact with Patient 11/20/15 1427     Chief Complaint  Patient presents with  . Foot Injury   (Consider location/radiation/quality/duration/timing/severity/associated sxs/prior Treatment) HPI 45 year old female was walking home from a party on uneven pavement and twisted her ankle and foot. This happened last night. Patient states that she has been elevating and icing her foot without significant relief of symptoms. Pain score at this time is for. Past Medical History:  Diagnosis Date  . Depression   . Migraines   . MVP (mitral valve prolapse)    Past Surgical History:  Procedure Laterality Date  . ABDOMINAL HYSTERECTOMY    . APPENDECTOMY     09/2015  . LAPAROSCOPIC APPENDECTOMY N/A 09/19/2015   Procedure: LAPAROSCOPIC APPENDECTOMY;  Surgeon: Stark Klein, MD;  Location: [place];  Service: General;  Laterality: N/A;   No family history on file. Social History  Substance Use Topics  . Smoking status: Never Smoker  . Smokeless tobacco: Not on file  . Alcohol use Yes     Comment: occasional   OB History    No data available     Review of Systems  Denies: HEADACHE, NAUSEA, ABDOMINAL PAIN, CHEST PAIN, CONGESTION, DYSURIA, SHORTNESS OF BREATH  Allergies  Levaquin [levofloxacin] and Other  Home Medications   Prior to Admission medications   Medication Sig Start Date End Date Taking? Authorizing Provider  ALPRAZolam (XANAX) 0.25 MG tablet Take 0.25 mg by mouth daily as needed. anxiety 08/25/15  Yes Historical Provider, MD  FLUoxetine (PROZAC) 20 MG capsule Take 20 mg by mouth daily.   Yes Historical Provider, MD  methylPREDNISolone (MEDROL DOSEPAK) 4 MG TBPK tablet Per package insert 09/20/15   Emina Riebock, NP  oxyCODONE-acetaminophen (ROXICET) 5-325 MG tablet Take 1 tablet by mouth every 4 (four) hours as needed for severe pain. 09/20/15   Emina Riebock, NP  phentermine 30 MG capsule Take 30 mg  by mouth daily. 09/02/15   Historical Provider, MD   Meds Ordered and Administered this Visit  Medications - No data to display  BP 124/77   Pulse 66   Temp 98.6 F (37 C) (Oral)   Resp 16   SpO2 97%  No data found.   Physical Exam NURSES NOTES AND VITAL SIGNS REVIEWED. CONSTITUTIONAL: Well developed, well nourished, no acute distress HEENT: normocephalic, atraumatic EYES: Conjunctiva normal NECK:normal ROM, supple, no adenopathy PULMONARY:No respiratory distress, normal effort ABDOMINAL: Soft, ND, NT BS+, No CVAT MUSCULOSKELETAL: Normal ROM of all extremities, left ankle and foot there is some swelling of the left ankle. There is tenderness noted over the ATFL. No lateral metatarsal tenderness. SKIN: warm and dry without rash PSYCHIATRIC: Mood and affect, behavior are normal  Urgent Care Course   Clinical Course    Procedures (including critical care time)  Labs Review Labs Reviewed - No data to display  Imaging Review Dg Ankle Complete Left  Result Date: 11/20/2015 CLINICAL DATA:  Patient twisted ankle 1 day ago.  Pain. EXAM: LEFT FOOT - COMPLETE 3+ VIEW; LEFT ANKLE COMPLETE - 3+ VIEW COMPARISON:  None. FINDINGS: There is no evidence of fracture or dislocation. There is no evidence of arthropathy or other focal bone abnormality. Soft tissue swelling dorsally and laterally. Incidental plantar spur. Hallux valgus. IMPRESSION: Negative for fracture or dislocation. Dorsal and lateral soft tissue swelling. Electronically Signed   By: Staci Righter M.D.   On: 11/20/2015 15:21  Dg Foot Complete Left  Result Date: 11/20/2015 CLINICAL DATA:  Patient twisted ankle 1 day ago.  Pain. EXAM: LEFT FOOT - COMPLETE 3+ VIEW; LEFT ANKLE COMPLETE - 3+ VIEW COMPARISON:  None. FINDINGS: There is no evidence of fracture or dislocation. There is no evidence of arthropathy or other focal bone abnormality. Soft tissue swelling dorsally and laterally. Incidental plantar spur. Hallux valgus.  IMPRESSION: Negative for fracture or dislocation. Dorsal and lateral soft tissue swelling. Electronically Signed   By: Staci Righter M.D.   On: 11/20/2015 15:21  Results are discussed with husband and patient prior to discharge.   Visual Acuity Review  Right Eye Distance:   Left Eye Distance:   Bilateral Distance:    Right Eye Near:   Left Eye Near:    Bilateral Near:        45 year old female with ankle and foot injury x-rays are negative symptomatic treatment at home. MDM   1. Foot sprain, left, initial encounter   2. Ankle sprain, left, initial encounter     Patient is reassured that there are no issues that require transfer to higher level of care at this time or additional tests. Patient is advised to continue home symptomatic treatment. Patient is advised that if there are new or worsening symptoms to attend the emergency department, contact primary care provider, or return to UC. Instructions of care provided discharged home in stable condition.    THIS NOTE WAS GENERATED USING A VOICE RECOGNITION SOFTWARE PROGRAM. ALL REASONABLE EFFORTS  WERE MADE TO PROOFREAD THIS DOCUMENT FOR ACCURACY.  I have verbally reviewed the discharge instructions with the patient. A printed AVS was given to the patient.  All questions were answered prior to discharge.      Konrad Felix, PA 11/20/15 2101

## 2018-01-14 ENCOUNTER — Other Ambulatory Visit: Payer: Self-pay | Admitting: Family Medicine

## 2018-01-14 DIAGNOSIS — Z1231 Encounter for screening mammogram for malignant neoplasm of breast: Secondary | ICD-10-CM

## 2018-02-05 ENCOUNTER — Other Ambulatory Visit: Payer: Self-pay | Admitting: Family Medicine

## 2018-02-05 DIAGNOSIS — Z711 Person with feared health complaint in whom no diagnosis is made: Secondary | ICD-10-CM

## 2018-02-05 DIAGNOSIS — R1011 Right upper quadrant pain: Secondary | ICD-10-CM

## 2018-02-13 ENCOUNTER — Ambulatory Visit
Admission: RE | Admit: 2018-02-13 | Discharge: 2018-02-13 | Disposition: A | Discharge status: Home / Self Care | Payer: BLUE CROSS/BLUE SHIELD | Source: Ambulatory Visit | Attending: Family Medicine | Admitting: Family Medicine

## 2018-02-13 DIAGNOSIS — Z711 Person with feared health complaint in whom no diagnosis is made: Secondary | ICD-10-CM

## 2018-02-13 DIAGNOSIS — R1011 Right upper quadrant pain: Secondary | ICD-10-CM

## 2018-02-21 ENCOUNTER — Ambulatory Visit
Admission: RE | Admit: 2018-02-21 | Discharge: 2018-02-21 | Disposition: A | Discharge status: Home / Self Care | Payer: BLUE CROSS/BLUE SHIELD | Source: Ambulatory Visit | Attending: Family Medicine | Admitting: Family Medicine

## 2018-02-21 DIAGNOSIS — Z1231 Encounter for screening mammogram for malignant neoplasm of breast: Secondary | ICD-10-CM

## 2018-02-24 ENCOUNTER — Other Ambulatory Visit: Payer: Self-pay | Admitting: Family Medicine

## 2018-02-24 DIAGNOSIS — R928 Other abnormal and inconclusive findings on diagnostic imaging of breast: Secondary | ICD-10-CM

## 2018-02-26 ENCOUNTER — Other Ambulatory Visit: Payer: Self-pay | Admitting: Family Medicine

## 2018-02-26 ENCOUNTER — Ambulatory Visit
Admission: RE | Admit: 2018-02-26 | Discharge: 2018-02-26 | Disposition: A | Discharge status: Home / Self Care | Payer: BLUE CROSS/BLUE SHIELD | Source: Ambulatory Visit | Attending: Family Medicine | Admitting: Family Medicine

## 2018-02-26 DIAGNOSIS — N6489 Other specified disorders of breast: Secondary | ICD-10-CM

## 2018-02-26 DIAGNOSIS — R928 Other abnormal and inconclusive findings on diagnostic imaging of breast: Secondary | ICD-10-CM

## 2018-03-06 ENCOUNTER — Ambulatory Visit
Admission: RE | Admit: 2018-03-06 | Discharge: 2018-03-06 | Disposition: A | Discharge status: Home / Self Care | Payer: BLUE CROSS/BLUE SHIELD | Source: Ambulatory Visit | Attending: Family Medicine | Admitting: Family Medicine

## 2018-03-06 ENCOUNTER — Other Ambulatory Visit: Payer: Self-pay | Admitting: Family Medicine

## 2018-03-06 DIAGNOSIS — N6489 Other specified disorders of breast: Secondary | ICD-10-CM

## 2018-04-10 ENCOUNTER — Ambulatory Visit: Payer: Self-pay | Admitting: General Surgery

## 2018-04-10 DIAGNOSIS — D242 Benign neoplasm of left breast: Secondary | ICD-10-CM

## 2018-04-11 ENCOUNTER — Other Ambulatory Visit: Payer: Self-pay | Admitting: General Surgery

## 2018-04-11 DIAGNOSIS — D242 Benign neoplasm of left breast: Secondary | ICD-10-CM

## 2018-04-23 ENCOUNTER — Encounter (HOSPITAL_BASED_OUTPATIENT_CLINIC_OR_DEPARTMENT_OTHER): Payer: Self-pay | Admitting: *Deleted

## 2018-04-23 ENCOUNTER — Other Ambulatory Visit: Payer: Self-pay

## 2018-04-23 NOTE — Progress Notes (Signed)
Pt's history of svt resolved with cardiac ablation in 1999 ( Dr Rollene Fare ) reviewed with Dr Ambrose Pancoast. No consult or testing needed prior to surgery with Dr Marlou Starks.  During Pre op call pt states that she will need to reschedule this surgery because her daughter is having surgery on the same day. She has already let [facility] know.  Call completed will just need to update time and instructions to pick up Pre Surgical drink when new date added.

## 2018-04-29 ENCOUNTER — Inpatient Hospital Stay: Admission: RE | Admit: 2018-04-29 | Payer: BLUE CROSS/BLUE SHIELD | Source: Ambulatory Visit

## 2018-04-30 ENCOUNTER — Ambulatory Visit (HOSPITAL_BASED_OUTPATIENT_CLINIC_OR_DEPARTMENT_OTHER)
Admission: RE | Admit: 2018-04-30 | Payer: BLUE CROSS/BLUE SHIELD | Source: Home / Self Care | Admitting: General Surgery

## 2018-04-30 HISTORY — DX: Supraventricular tachycardia, unspecified: I47.10

## 2018-04-30 HISTORY — DX: Supraventricular tachycardia: I47.1

## 2018-04-30 SURGERY — BREAST LUMPECTOMY WITH RADIOACTIVE SEED LOCALIZATION
Anesthesia: General | Laterality: Left

## 2018-05-08 ENCOUNTER — Other Ambulatory Visit: Payer: Self-pay | Admitting: Family Medicine

## 2018-05-08 DIAGNOSIS — R102 Pelvic and perineal pain: Secondary | ICD-10-CM

## 2018-05-13 ENCOUNTER — Ambulatory Visit
Admission: RE | Admit: 2018-05-13 | Discharge: 2018-05-13 | Disposition: A | Discharge status: Home / Self Care | Payer: BLUE CROSS/BLUE SHIELD | Source: Ambulatory Visit | Attending: Family Medicine | Admitting: Family Medicine

## 2018-05-13 DIAGNOSIS — R102 Pelvic and perineal pain: Secondary | ICD-10-CM

## 2018-07-11 ENCOUNTER — Inpatient Hospital Stay: Admission: RE | Admit: 2018-07-11 | Payer: BLUE CROSS/BLUE SHIELD | Source: Ambulatory Visit

## 2018-07-14 ENCOUNTER — Ambulatory Visit (HOSPITAL_BASED_OUTPATIENT_CLINIC_OR_DEPARTMENT_OTHER): Admit: 2018-07-14 | Payer: BLUE CROSS/BLUE SHIELD | Admitting: General Surgery

## 2018-07-14 ENCOUNTER — Encounter (HOSPITAL_BASED_OUTPATIENT_CLINIC_OR_DEPARTMENT_OTHER): Payer: Self-pay

## 2018-07-14 SURGERY — BREAST LUMPECTOMY WITH RADIOACTIVE SEED LOCALIZATION
Anesthesia: General | Site: Breast | Laterality: Left

## 2018-09-16 ENCOUNTER — Encounter (HOSPITAL_BASED_OUTPATIENT_CLINIC_OR_DEPARTMENT_OTHER): Payer: Self-pay | Admitting: *Deleted

## 2018-09-16 ENCOUNTER — Other Ambulatory Visit: Payer: Self-pay

## 2018-09-19 ENCOUNTER — Other Ambulatory Visit (HOSPITAL_COMMUNITY)
Admission: RE | Admit: 2018-09-19 | Discharge: 2018-09-19 | Disposition: A | Discharge status: Home / Self Care | Payer: BC Managed Care – PPO | Source: Ambulatory Visit | Attending: General Surgery | Admitting: General Surgery

## 2018-09-19 DIAGNOSIS — Z1159 Encounter for screening for other viral diseases: Secondary | ICD-10-CM | POA: Diagnosis not present

## 2018-09-19 LAB — SARS CORONAVIRUS 2 (TAT 6-24 HRS): SARS Coronavirus 2: NEGATIVE

## 2018-09-22 ENCOUNTER — Ambulatory Visit: Payer: Self-pay | Admitting: General Surgery

## 2018-09-22 ENCOUNTER — Ambulatory Visit
Admission: RE | Admit: 2018-09-22 | Discharge: 2018-09-22 | Disposition: A | Discharge status: Home / Self Care | Payer: BLUE CROSS/BLUE SHIELD | Source: Ambulatory Visit | Attending: General Surgery | Admitting: General Surgery

## 2018-09-22 DIAGNOSIS — D242 Benign neoplasm of left breast: Secondary | ICD-10-CM

## 2018-09-22 NOTE — Progress Notes (Signed)
Ensure pre surgery drink given with instructions to complete by [place], pt verbalized  Understanding.

## 2018-09-23 ENCOUNTER — Encounter (HOSPITAL_BASED_OUTPATIENT_CLINIC_OR_DEPARTMENT_OTHER): Payer: Self-pay | Admitting: *Deleted

## 2018-09-23 ENCOUNTER — Other Ambulatory Visit: Payer: Self-pay

## 2018-09-23 ENCOUNTER — Ambulatory Visit (HOSPITAL_BASED_OUTPATIENT_CLINIC_OR_DEPARTMENT_OTHER)
Admission: RE | Admit: 2018-09-23 | Discharge: 2018-09-23 | Disposition: A | Discharge status: Home / Self Care | Payer: BC Managed Care – PPO | Attending: General Surgery | Admitting: General Surgery

## 2018-09-23 ENCOUNTER — Ambulatory Visit (HOSPITAL_BASED_OUTPATIENT_CLINIC_OR_DEPARTMENT_OTHER): Payer: BC Managed Care – PPO | Admitting: Anesthesiology

## 2018-09-23 ENCOUNTER — Encounter (HOSPITAL_BASED_OUTPATIENT_CLINIC_OR_DEPARTMENT_OTHER)
Admission: RE | Disposition: A | Discharge status: Home / Self Care | Payer: Self-pay | Source: Home / Self Care | Attending: General Surgery

## 2018-09-23 ENCOUNTER — Ambulatory Visit
Admission: RE | Admit: 2018-09-23 | Discharge: 2018-09-23 | Disposition: A | Discharge status: Home / Self Care | Payer: BLUE CROSS/BLUE SHIELD | Source: Ambulatory Visit | Attending: General Surgery | Admitting: General Surgery

## 2018-09-23 DIAGNOSIS — Z803 Family history of malignant neoplasm of breast: Secondary | ICD-10-CM | POA: Diagnosis not present

## 2018-09-23 DIAGNOSIS — D242 Benign neoplasm of left breast: Secondary | ICD-10-CM | POA: Diagnosis present

## 2018-09-23 DIAGNOSIS — F329 Major depressive disorder, single episode, unspecified: Secondary | ICD-10-CM | POA: Diagnosis not present

## 2018-09-23 HISTORY — PX: BREAST EXCISIONAL BIOPSY: SUR124

## 2018-09-23 HISTORY — PX: BREAST LUMPECTOMY WITH RADIOACTIVE SEED LOCALIZATION: SHX6424

## 2018-09-23 SURGERY — BREAST LUMPECTOMY WITH RADIOACTIVE SEED LOCALIZATION
Anesthesia: General | Site: Breast | Laterality: Left

## 2018-09-23 MED ORDER — PROPOFOL 10 MG/ML IV BOLUS
INTRAVENOUS | Status: DC | PRN
  Administered 2018-09-23: 150 mg via INTRAVENOUS

## 2018-09-23 MED ORDER — FENTANYL CITRATE (PF) 100 MCG/2ML IJ SOLN
50.0000 ug | INTRAMUSCULAR | Status: DC | PRN
  Administered 2018-09-23: 100 ug via INTRAVENOUS

## 2018-09-23 MED ORDER — DEXAMETHASONE SODIUM PHOSPHATE 4 MG/ML IJ SOLN
INTRAMUSCULAR | Status: DC | PRN
  Administered 2018-09-23: 10 mg via INTRAVENOUS

## 2018-09-23 MED ORDER — MIDAZOLAM HCL 2 MG/2ML IJ SOLN
1.0000 mg | INTRAMUSCULAR | Status: DC | PRN
  Administered 2018-09-23: 2 mg via INTRAVENOUS

## 2018-09-23 MED ORDER — ACETAMINOPHEN 500 MG PO TABS
1000.0000 mg | ORAL_TABLET | ORAL | Status: AC
  Administered 2018-09-23: 1000 mg via ORAL

## 2018-09-23 MED ORDER — FENTANYL CITRATE (PF) 100 MCG/2ML IJ SOLN: 25.0000 ug | INTRAMUSCULAR | Status: DC | PRN

## 2018-09-23 MED ORDER — CELECOXIB 200 MG PO CAPS
ORAL_CAPSULE | ORAL | Status: AC
  Filled 2018-09-23: qty 1

## 2018-09-23 MED ORDER — LIDOCAINE HCL (CARDIAC) PF 100 MG/5ML IV SOSY
PREFILLED_SYRINGE | INTRAVENOUS | Status: DC | PRN
  Administered 2018-09-23: 100 mg via INTRAVENOUS

## 2018-09-23 MED ORDER — BUPIVACAINE-EPINEPHRINE (PF) 0.25% -1:200000 IJ SOLN
INTRAMUSCULAR | Status: DC | PRN
  Administered 2018-09-23: 20 mL

## 2018-09-23 MED ORDER — CEFAZOLIN SODIUM-DEXTROSE 2-4 GM/100ML-% IV SOLN
2.0000 g | INTRAVENOUS | Status: AC
  Administered 2018-09-23: 2 g via INTRAVENOUS

## 2018-09-23 MED ORDER — LACTATED RINGERS IV SOLN
INTRAVENOUS | Status: DC
  Administered 2018-09-23: 07:00:00 via INTRAVENOUS

## 2018-09-23 MED ORDER — FENTANYL CITRATE (PF) 100 MCG/2ML IJ SOLN
INTRAMUSCULAR | Status: AC
  Filled 2018-09-23: qty 2

## 2018-09-23 MED ORDER — PROPOFOL 500 MG/50ML IV EMUL
INTRAVENOUS | Status: AC
  Filled 2018-09-23: qty 50

## 2018-09-23 MED ORDER — CELECOXIB 200 MG PO CAPS
200.0000 mg | ORAL_CAPSULE | ORAL | Status: AC
  Administered 2018-09-23: 200 mg via ORAL

## 2018-09-23 MED ORDER — CEFAZOLIN SODIUM-DEXTROSE 2-4 GM/100ML-% IV SOLN
INTRAVENOUS | Status: AC
  Filled 2018-09-23: qty 100

## 2018-09-23 MED ORDER — DEXAMETHASONE SODIUM PHOSPHATE 10 MG/ML IJ SOLN
INTRAMUSCULAR | Status: AC
  Filled 2018-09-23: qty 1

## 2018-09-23 MED ORDER — ONDANSETRON HCL 4 MG/2ML IJ SOLN
INTRAMUSCULAR | Status: DC | PRN
  Administered 2018-09-23: 4 mg via INTRAVENOUS

## 2018-09-23 MED ORDER — SCOPOLAMINE 1 MG/3DAYS TD PT72: 1.0000 | MEDICATED_PATCH | Freq: Once | TRANSDERMAL | Status: DC

## 2018-09-23 MED ORDER — GABAPENTIN 300 MG PO CAPS
ORAL_CAPSULE | ORAL | Status: AC
  Filled 2018-09-23: qty 1

## 2018-09-23 MED ORDER — GABAPENTIN 300 MG PO CAPS
300.0000 mg | ORAL_CAPSULE | ORAL | Status: AC
  Administered 2018-09-23: 300 mg via ORAL

## 2018-09-23 MED ORDER — ACETAMINOPHEN 500 MG PO TABS
ORAL_TABLET | ORAL | Status: AC
  Filled 2018-09-23: qty 2

## 2018-09-23 MED ORDER — CHLORHEXIDINE GLUCONATE CLOTH 2 % EX PADS: 6.0000 | MEDICATED_PAD | Freq: Once | CUTANEOUS | Status: DC

## 2018-09-23 MED ORDER — HYDROCODONE-ACETAMINOPHEN 5-325 MG PO TABS: 1.0000 | ORAL_TABLET | Freq: Four times a day (QID) | ORAL | 0 refills | Status: DC | PRN

## 2018-09-23 MED ORDER — LIDOCAINE 2% (20 MG/ML) 5 ML SYRINGE
INTRAMUSCULAR | Status: AC
  Filled 2018-09-23: qty 5

## 2018-09-23 MED ORDER — BUPIVACAINE-EPINEPHRINE (PF) 0.25% -1:200000 IJ SOLN
INTRAMUSCULAR | Status: AC
  Filled 2018-09-23: qty 30

## 2018-09-23 MED ORDER — MIDAZOLAM HCL 2 MG/2ML IJ SOLN
INTRAMUSCULAR | Status: AC
  Filled 2018-09-23: qty 2

## 2018-09-23 MED ORDER — ONDANSETRON HCL 4 MG/2ML IJ SOLN
INTRAMUSCULAR | Status: AC
  Filled 2018-09-23: qty 2

## 2018-09-23 SURGICAL SUPPLY — 46 items
ADH SKN CLS APL DERMABOND .7 (GAUZE/BANDAGES/DRESSINGS) ×1
APL PRP STRL LF DISP 70% ISPRP (MISCELLANEOUS) ×1
APPLIER CLIP 9.375 MED OPEN (MISCELLANEOUS)
APR CLP MED 9.3 20 MLT OPN (MISCELLANEOUS)
BLADE SURG 15 STRL LF DISP TIS (BLADE) ×1 IMPLANT
BLADE SURG 15 STRL SS (BLADE) ×2
CANISTER SUC SOCK COL 7IN (MISCELLANEOUS) ×2 IMPLANT
CANISTER SUCT 1200ML W/VALVE (MISCELLANEOUS) ×2 IMPLANT
CHLORAPREP W/TINT 26 (MISCELLANEOUS) ×2 IMPLANT
CLIP APPLIE 9.375 MED OPEN (MISCELLANEOUS) IMPLANT
COVER BACK TABLE REUSABLE LG (DRAPES) ×2 IMPLANT
COVER MAYO STAND REUSABLE (DRAPES) ×2 IMPLANT
COVER PROBE W GEL 5X96 (DRAPES) ×2 IMPLANT
COVER WAND RF STERILE (DRAPES) IMPLANT
DECANTER SPIKE VIAL GLASS SM (MISCELLANEOUS) IMPLANT
DERMABOND ADVANCED (GAUZE/BANDAGES/DRESSINGS) ×1
DERMABOND ADVANCED .7 DNX12 (GAUZE/BANDAGES/DRESSINGS) ×1 IMPLANT
DRAPE LAPAROSCOPIC ABDOMINAL (DRAPES) ×2 IMPLANT
DRAPE UTILITY XL STRL (DRAPES) ×2 IMPLANT
ELECT COATED BLADE 2.86 ST (ELECTRODE) ×2 IMPLANT
ELECT REM PT RETURN 9FT ADLT (ELECTROSURGICAL) ×2
ELECTRODE REM PT RTRN 9FT ADLT (ELECTROSURGICAL) ×1 IMPLANT
GLOVE BIO SURGEON STRL SZ 6.5 (GLOVE) ×1 IMPLANT
GLOVE BIO SURGEON STRL SZ7.5 (GLOVE) ×4 IMPLANT
GLOVE BIOGEL PI IND STRL 7.0 (GLOVE) IMPLANT
GLOVE BIOGEL PI INDICATOR 7.0 (GLOVE) ×2
GOWN STRL REUS W/ TWL LRG LVL3 (GOWN DISPOSABLE) ×2 IMPLANT
GOWN STRL REUS W/TWL LRG LVL3 (GOWN DISPOSABLE) ×4
ILLUMINATOR WAVEGUIDE N/F (MISCELLANEOUS) IMPLANT
KIT MARKER MARGIN INK (KITS) ×2 IMPLANT
LIGHT WAVEGUIDE WIDE FLAT (MISCELLANEOUS) IMPLANT
NDL HYPO 25X1 1.5 SAFETY (NEEDLE) IMPLANT
NEEDLE HYPO 25X1 1.5 SAFETY (NEEDLE) IMPLANT
NS IRRIG 1000ML POUR BTL (IV SOLUTION) IMPLANT
PACK BASIN DAY SURGERY FS (CUSTOM PROCEDURE TRAY) ×2 IMPLANT
PENCIL BUTTON HOLSTER BLD 10FT (ELECTRODE) ×2 IMPLANT
SLEEVE SCD COMPRESS KNEE MED (MISCELLANEOUS) ×2 IMPLANT
SPONGE LAP 18X18 RF (DISPOSABLE) ×2 IMPLANT
SUT MON AB 4-0 PC3 18 (SUTURE) IMPLANT
SUT SILK 2 0 SH (SUTURE) IMPLANT
SUT VICRYL 3-0 CR8 SH (SUTURE) ×2 IMPLANT
SYR CONTROL 10ML LL (SYRINGE) IMPLANT
TOWEL GREEN STERILE FF (TOWEL DISPOSABLE) ×2 IMPLANT
TRAY FAXITRON CT DISP (TRAY / TRAY PROCEDURE) ×2 IMPLANT
TUBE CONNECTING 20X1/4 (TUBING) ×2 IMPLANT
YANKAUER SUCT BULB TIP NO VENT (SUCTIONS) IMPLANT

## 2018-09-23 NOTE — Interval H&P Note (Signed)
History and Physical Interval Note:  09/23/2018 7:09 AM  Dawn Burton  has presented today for surgery, with the diagnosis of left breast papilloma.  The various methods of treatment have been discussed with the patient and family. After consideration of risks, benefits and other options for treatment, the patient has consented to  Procedure(s): LEFT BREAST LUMPECTOMY WITH RADIOACTIVE SEED LOCALIZATION (Left) as a surgical intervention.  The patient's history has been reviewed, patient examined, no change in status, stable for surgery.  I have reviewed the patient's chart and labs.  Questions were answered to the patient's satisfaction.     [name]

## 2018-09-23 NOTE — Anesthesia Procedure Notes (Signed)
Procedure Name: LMA Insertion Date/Time: 09/23/2018 7:26 AM Performed by: Maryella Shivers, CRNA Pre-anesthesia Checklist: Patient identified, Emergency Drugs available, Suction available and Patient being monitored Patient Re-evaluated:Patient Re-evaluated prior to induction Oxygen Delivery Method: Circle system utilized Preoxygenation: Pre-oxygenation with 100% oxygen Induction Type: IV induction Ventilation: Mask ventilation without difficulty LMA: LMA inserted LMA Size: 4.0 Number of attempts: 1 Airway Equipment and Method: Bite block Placement Confirmation: positive ETCO2 Tube secured with: Tape Dental Injury: Teeth and Oropharynx as per pre-operative assessment

## 2018-09-23 NOTE — Transfer of Care (Signed)
Immediate Anesthesia Transfer of Care Note  Patient: Dawn Burton  Procedure(s) Performed: LEFT BREAST LUMPECTOMY WITH RADIOACTIVE SEED LOCALIZATION (Left Breast)  Patient Location: PACU  Anesthesia Type:General  Level of Consciousness: sedated  Airway & Oxygen Therapy: Patient Spontanous Breathing and Patient connected to face mask oxygen  Post-op Assessment: Report given to RN and Post -op Vital signs reviewed and stable  Post vital signs: Reviewed and stable  Last Vitals:  Vitals Value Taken Time  BP 121/78 09/23/18 0807  Temp    Pulse 47 09/23/18 0806  Resp 14 09/23/18 0809  SpO2 100 % 09/23/18 0806  Vitals shown include unvalidated device data.  Last Pain:  Vitals:   09/23/18 0639  PainSc: 0-No pain         Complications: No apparent anesthesia complications

## 2018-09-23 NOTE — Anesthesia Postprocedure Evaluation (Signed)
Anesthesia Post Note  Patient: Dawn Burton  Procedure(s) Performed: LEFT BREAST LUMPECTOMY WITH RADIOACTIVE SEED LOCALIZATION (Left Breast)     Patient location during evaluation: PACU Anesthesia Type: General Level of consciousness: awake and alert Pain management: pain level controlled Vital Signs Assessment: post-procedure vital signs reviewed and stable Respiratory status: spontaneous breathing, nonlabored ventilation, respiratory function stable and patient connected to nasal cannula oxygen Cardiovascular status: blood pressure returned to baseline and stable Postop Assessment: no apparent nausea or vomiting Anesthetic complications: no    Last Vitals:  Vitals:   09/23/18 0815 09/23/18 0830  BP: 128/71 128/76  Pulse: (!) 58 (!) 45  Resp: 14 14  Temp:    SpO2: 100% 100%    Last Pain:  Vitals:   09/23/18 0830  PainSc: 0-No pain                 [name]

## 2018-09-23 NOTE — H&P (Signed)
Dawn Burton  Location: [facility] Patient #: [id number] DOB: 1970/05/12 Divorced / Language: Dawn Burton / Race: White Female   History of Present Illness  The patient is a 48 year old female who presents with a breast mass. We are asked to see the patient in consultation by Dr. Lajean Manes to evaluate her for an intraductal papilloma. The patient is a 48 year old white female who recently went for a routine screening mammogram. At that time she was found to have a small 7 mm mass in the lower portion of the left breast. This was biopsied and came back as an intraductal papilloma. She does not smoke. She does have a significant family history of breast cancer on both sides of her family. She does have retropectoral implants.   Allergies  LevoFLOXacin *CHEMICALS*  Allergies Reconciled   Medication History  FLUoxetine HCl (20MG  Tablet, Oral) Active. Azithromycin (250MG  Capsule, Oral) Active. Medications Reconciled    Review of Systems General Not Present- Appetite Loss, Chills, Fatigue, Fever, Night Sweats, Weight Gain and Weight Loss. Note: All other systems negative (unless as noted in HPI & included Review of Systems) Skin Not Present- Change in Wart/Mole, Dryness, Hives, Jaundice, New Lesions, Non-Healing Wounds, Rash and Ulcer. HEENT Not Present- Earache, Hearing Loss, Hoarseness, Nose Bleed, Oral Ulcers, Ringing in the Ears, Seasonal Allergies, Sinus Pain, Sore Throat, Visual Disturbances, Wears glasses/contact lenses and Yellow Eyes. Respiratory Not Present- Bloody sputum, Chronic Cough, Difficulty Breathing, Snoring and Wheezing. Breast Not Present- Breast Mass, Breast Pain, Nipple Discharge and Skin Changes. Cardiovascular Not Present- Chest Pain, Difficulty Breathing Lying Down, Leg Cramps, Palpitations, Rapid Heart Rate, Shortness of Breath and Swelling of Extremities. Gastrointestinal Not Present- Abdominal Pain, Bloating, Bloody Stool, Change in  Bowel Habits, Chronic diarrhea, Constipation, Difficulty Swallowing, Excessive gas, Gets full quickly at meals, Hemorrhoids, Indigestion, Nausea, Rectal Pain and Vomiting. Female Genitourinary Not Present- Frequency, Nocturia, Painful Urination, Pelvic Pain and Urgency. Musculoskeletal Not Present- Back Pain, Joint Pain, Joint Stiffness, Muscle Pain, Muscle Weakness and Swelling of Extremities. Neurological Not Present- Decreased Memory, Fainting, Headaches, Numbness, Seizures, Tingling, Tremor, Trouble walking and Weakness. Psychiatric Not Present- Anxiety, Bipolar, Change in Sleep Pattern, Depression, Fearful and Frequent crying. Endocrine Not Present- Cold Intolerance, Excessive Hunger, Hair Changes, Heat Intolerance, Hot flashes and New Diabetes. Hematology Not Present- Easy Bruising, Excessive bleeding, Gland problems, HIV and Persistent Infections.  Vitals  Weight: 260.25 lb Height: 72in Body Surface Area: 2.38 m Body Mass Index: 35.3 kg/m  Temp.: 97.55F  Pulse: 61 (Regular)  P.OX: 100% (Room air) BP: 140/62(Sitting, Left Arm, Standard)       Physical Exam General Mental Status-Alert. General Appearance-Consistent with stated age. Hydration-Well hydrated. Voice-Normal.  Head and Neck Head-normocephalic, atraumatic with no lesions or palpable masses. Trachea-midline. Thyroid Gland Characteristics - normal size and consistency.  Eye Eyeball - Bilateral-Extraocular movements intact. Sclera/Conjunctiva - Bilateral-No scleral icterus.  Chest and Lung Exam Chest and lung exam reveals -quiet, even and easy respiratory effort with no use of accessory muscles and on auscultation, normal breath sounds, no adventitious sounds and normal vocal resonance. Inspection Chest Wall - Normal. Back - normal.  Breast Note: There is no palpable mass in either breast. There is no palpable axillary, supraclavicular, or cervical  lymphadenopathy.   Cardiovascular Cardiovascular examination reveals -normal heart sounds, regular rate and rhythm with no murmurs and normal pedal pulses bilaterally.  Abdomen Inspection Inspection of the abdomen reveals - No Hernias. Skin - Scar - no surgical scars. Palpation/Percussion Palpation and Percussion  of the abdomen reveal - Soft, Non Tender, No Rebound tenderness, No Rigidity (guarding) and No hepatosplenomegaly. Auscultation Auscultation of the abdomen reveals - Bowel sounds normal.  Neurologic Neurologic evaluation reveals -alert and oriented x 3 with no impairment of recent or remote memory. Mental Status-Normal.  Musculoskeletal Normal Exam - Left-Upper Extremity Strength Normal and Lower Extremity Strength Normal. Normal Exam - Right-Upper Extremity Strength Normal and Lower Extremity Strength Normal.  Lymphatic Head & Neck  General Head & Neck Lymphatics: Bilateral - Description - Normal. Axillary  General Axillary Region: Bilateral - Description - Normal. Tenderness - Non Tender. Femoral & Inguinal  Generalized Femoral & Inguinal Lymphatics: Bilateral - Description - Normal. Tenderness - Non Tender.    Assessment & Plan   INTRADUCTAL PAPILLOMA OF BREAST, LEFT (D24.2) Impression: The patient appears to have a small intraductal papilloma in the lower left breast. Because of the risk of missing something more significant and because of her significant family history I think it would be reasonable to remove this area. She would also like to have this done. I have discussed with her in detail the risks and benefits of the operation to remove this area as well as some of the technical aspects and she understands and wishes to proceed. I will plan for a left breast radioactive seed localized lumpectomy.  Current Plans Referred to Oncology, for evaluation and follow up (Oncology). Routine.

## 2018-09-23 NOTE — Anesthesia Preprocedure Evaluation (Addendum)
Anesthesia Evaluation  Patient identified by MRN, date of birth, ID band Patient awake    Reviewed: Allergy & Precautions, NPO status , Patient's Chart, lab work & pertinent test results  Airway Mallampati: I  TM Distance: >3 FB Neck ROM: Full    Dental no notable dental hx. (+) Teeth Intact, Dental Advisory Given   Pulmonary neg pulmonary ROS,    Pulmonary exam normal breath sounds clear to auscultation       Cardiovascular Normal cardiovascular exam+ dysrhythmias (s/p ablation 1999) Supra Ventricular Tachycardia  Rhythm:Regular Rate:Normal     Neuro/Psych  Headaches, PSYCHIATRIC DISORDERS Depression    GI/Hepatic negative GI ROS, Neg liver ROS,   Endo/Other  negative endocrine ROS  Renal/GU negative Renal ROS  negative genitourinary   Musculoskeletal negative musculoskeletal ROS (+)   Abdominal   Peds  Hematology negative hematology ROS (+)   Anesthesia Other Findings Left breast papilloma  Reproductive/Obstetrics                            Anesthesia Physical Anesthesia Plan  ASA: II  Anesthesia Plan: General   Post-op Pain Management:    Induction: Intravenous  PONV Risk Score and Plan: 3 and Midazolam, Dexamethasone and Ondansetron  Airway Management Planned: LMA  Additional Equipment:   Intra-op Plan:   Post-operative Plan: Extubation in OR  Informed Consent: I have reviewed the patients History and Physical, chart, labs and discussed the procedure including the risks, benefits and alternatives for the proposed anesthesia with the patient or authorized representative who has indicated his/her understanding and acceptance.     Dental advisory given  Plan Discussed with: CRNA  Anesthesia Plan Comments:         Anesthesia Quick Evaluation

## 2018-09-23 NOTE — Discharge Instructions (Signed)

## 2018-09-23 NOTE — Op Note (Signed)
09/23/2018  7:57 AM  PATIENT:  Dawn Burton  48 y.o. female  PRE-OPERATIVE DIAGNOSIS:  left breast papilloma  POST-OPERATIVE DIAGNOSIS:  Left breast papilloma  PROCEDURE:  Procedure(s): LEFT BREAST LUMPECTOMY WITH RADIOACTIVE SEED LOCALIZATION (Left)  SURGEON:  Surgeon(s) and Role:    Jovita Kussmaul, MD - Primary  PHYSICIAN ASSISTANT:   ASSISTANTS: none   ANESTHESIA:   local and general  EBL:  5 mL   BLOOD ADMINISTERED:none  DRAINS: none   LOCAL MEDICATIONS USED:  MARCAINE     SPECIMEN:  Source of Specimen:  left breast tissue  DISPOSITION OF SPECIMEN:  PATHOLOGY  COUNTS:  YES  TOURNIQUET:  * No tourniquets in log *  DICTATION: .Dragon Dictation   After informed consent was obtained the patient was brought to the operating room and placed in the supine position on the operating table.  After adequate induction of general anesthesia the patient's left breast was prepped with ChloraPrep, allowed to dry, and draped in usual sterile manner.  An appropriate timeout was performed.  Previously an I-125 seed was placed in the lower central left breast to mark an area of an intraductal papilloma.  The neoprobe was set to I-125 in the area of radioactivity was readily identified.  The area around this was infiltrated with quarter percent Marcaine.  A curvilinear incision was made along the lower edge of the areola with a 15 blade knife.  The incision was carried through the skin and subcutaneous tissue sharply with electrocautery.  Dissection was then carried sharply towards the radioactive seed under the direction of the neoprobe.  Once I more closely approached the radioactive seed I then removed a circular portion of breast tissue sharply with the electrocautery around the radioactive seed while checking the area of radioactivity frequently.  Once the specimen was removed it was oriented with the appropriate paint colors.  A specimen radiograph showed the clip and  seed to be near the center of the specimen.  The specimen was then sent to pathology for further evaluation.  Hemostasis was achieved using the Bovie electrocautery.  The wound was irrigated with saline and infiltrated with more quarter percent Marcaine.  The deep layer of the wound was then closed with interrupted 3-0 Vicryl stitches.  The skin was closed with interrupted 4-0 Monocryl subcuticular stitches.  Dermabond dressings were applied.  The patient tolerated the procedure well.  At the end of the case all needle sponge and instrument counts were correct.  The patient was then awakened and taken to recovery in stable condition.  PLAN OF CARE: Discharge to home after PACU  PATIENT DISPOSITION:  PACU - hemodynamically stable.   Delay start of Pharmacological VTE agent (>24hrs) due to surgical blood loss or risk of bleeding: not applicable

## 2018-09-24 ENCOUNTER — Encounter (HOSPITAL_BASED_OUTPATIENT_CLINIC_OR_DEPARTMENT_OTHER): Payer: Self-pay | Admitting: General Surgery

## 2020-10-17 ENCOUNTER — Emergency Department (HOSPITAL_BASED_OUTPATIENT_CLINIC_OR_DEPARTMENT_OTHER): Payer: BC Managed Care – PPO

## 2020-10-17 ENCOUNTER — Encounter (HOSPITAL_BASED_OUTPATIENT_CLINIC_OR_DEPARTMENT_OTHER): Payer: Self-pay | Admitting: *Deleted

## 2020-10-17 ENCOUNTER — Other Ambulatory Visit: Payer: Self-pay

## 2020-10-17 ENCOUNTER — Emergency Department (HOSPITAL_BASED_OUTPATIENT_CLINIC_OR_DEPARTMENT_OTHER)
Admission: EM | Admit: 2020-10-17 | Discharge: 2020-10-17 | Disposition: A | Discharge status: Home / Self Care | Payer: BC Managed Care – PPO | Attending: Emergency Medicine | Admitting: Emergency Medicine

## 2020-10-17 DIAGNOSIS — R519 Headache, unspecified: Secondary | ICD-10-CM | POA: Diagnosis not present

## 2020-10-17 DIAGNOSIS — W01198A Fall on same level from slipping, tripping and stumbling with subsequent striking against other object, initial encounter: Secondary | ICD-10-CM | POA: Insufficient documentation

## 2020-10-17 DIAGNOSIS — M542 Cervicalgia: Secondary | ICD-10-CM | POA: Diagnosis not present

## 2020-10-17 DIAGNOSIS — R030 Elevated blood-pressure reading, without diagnosis of hypertension: Secondary | ICD-10-CM

## 2020-10-17 DIAGNOSIS — S161XXA Strain of muscle, fascia and tendon at neck level, initial encounter: Secondary | ICD-10-CM

## 2020-10-17 DIAGNOSIS — S0003XA Contusion of scalp, initial encounter: Secondary | ICD-10-CM

## 2020-10-17 DIAGNOSIS — W010XXA Fall on same level from slipping, tripping and stumbling without subsequent striking against object, initial encounter: Secondary | ICD-10-CM

## 2020-10-17 MED ORDER — METHOCARBAMOL 750 MG PO TABS: 750.0000 mg | ORAL_TABLET | Freq: Three times a day (TID) | ORAL | 0 refills | Status: DC | PRN

## 2020-10-17 NOTE — Discharge Instructions (Addendum)
It was our pleasure to provide your ER care today - we hope that you feel better.  Try gentle massage and/or heat therapy to sore area. Take acetaminophen or ibuprofen as need for pain. You may also take robaxin as need for muscle pain/spasm - no driving when taking.   Follow up with primary care doctor in 1-2 weeks if symptoms fail to improve/resolve.  Also follow up with primary care doctor in the next couple weeks for recheck of blood pressure which is high today.   Return to ER if worse, new symptoms, fevers, new, severe or worsening pain, or other concern.

## 2020-10-17 NOTE — ED Notes (Signed)
Patient transported to CT 

## 2020-10-17 NOTE — ED Triage Notes (Signed)
C/o fall x 1 week ago hitting head on metal truck bed , no LOC , c/o neck pain and h/a and increased BP

## 2020-10-17 NOTE — ED Provider Notes (Signed)
[facility] EMERGENCY DEPARTMENT Provider Note   CSN: [id number] Arrival date & time: 10/17/20  1617     History Chief Complaint  Patient presents with   Neck Injury    Fall     Dawn Burton is a 50 y.o. female.  Patient c/o slip/trip and fall ~ 1 week ago, hitting back of head on vehicle. No loc then. Since fall, intermittent dull headaches, and right posterior/lateral neck pain. No radicular pain or any numbness/weakness. No change in speech or vision. No numbness or weakness. No vomiting. No back pain. Denies other pain or injury. Gets transient relief of symptoms w acetaminophen or ibuprofen. Denies faintness or dizziness prior to fall, was mechanical fall.   The history is provided by the patient.  Neck Injury Associated symptoms include headaches. Pertinent negatives include no chest pain, no abdominal pain and no shortness of breath.      Past Medical History:  Diagnosis Date   Depression    Migraines    MVP (mitral valve prolapse)    SVT (supraventricular tachycardia) ([facility])    had ablation in 1999     Patient Active Problem List   Diagnosis Date Noted   Acute appendicitis 09/18/2015    Past Surgical History:  Procedure Laterality Date   ABDOMINAL HYSTERECTOMY     APPENDECTOMY     09/2015   AUGMENTATION MAMMAPLASTY Bilateral    BREAST LUMPECTOMY WITH RADIOACTIVE SEED LOCALIZATION Left 09/23/2018   Procedure: LEFT BREAST LUMPECTOMY WITH RADIOACTIVE SEED LOCALIZATION;  Surgeon: Jovita Kussmaul, MD;  Location: [facility];  Service: General;  Laterality: Left;   [place] N/A 09/19/2015   Procedure: LAPAROSCOPIC APPENDECTOMY;  Surgeon: Stark Klein, MD;  Location: [facility];  Service: General;  Laterality: N/A;     OB History   No obstetric history on file.     No family history on file.  Social History   Tobacco Use   Smoking status: Never   Smokeless  tobacco: Never  Vaping Use   Vaping Use: Never used  Substance Use Topics   Alcohol use: Yes    Comment: occasional   Drug use: No    Home Medications Prior to Admission medications   Medication Sig Start Date End Date Taking? Authorizing Provider  HYDROcodone-acetaminophen (NORCO/VICODIN) 5-325 MG tablet Take 1-2 tablets by mouth every 6 (six) hours as needed for moderate pain or severe pain. 09/23/18   Jovita Kussmaul, MD    Allergies    Levaquin [levofloxacin] and Other  Review of Systems   Review of Systems  Constitutional:  Negative for fever.  HENT:  Negative for nosebleeds.   Eyes:  Negative for redness and visual disturbance.  Respiratory:  Negative for shortness of breath.   Cardiovascular:  Negative for chest pain.  Gastrointestinal:  Negative for abdominal pain and vomiting.  Genitourinary:  Negative for flank pain.  Musculoskeletal:  Positive for neck pain. Negative for back pain.  Skin:  Negative for wound.  Neurological:  Positive for headaches. Negative for weakness and numbness.  Hematological:  Does not bruise/bleed easily.  Psychiatric/Behavioral:  Negative for confusion.    Physical Exam Updated Vital Signs BP (!) 147/99   Pulse (!) 51   Temp 98.1 F (36.7 C) (Oral)   Resp 16   Ht 1.803 m (5\' 11" )   Wt 98.9 kg   SpO2 98%   BMI 30.40 kg/m   Physical Exam Vitals  and nursing note reviewed.  Constitutional:      Appearance: Normal appearance. She is well-developed.  HENT:     Head:     Comments: Tenderness posterior scalp.     Nose: Nose normal.     Mouth/Throat:     Mouth: Mucous membranes are moist.  Eyes:     General: No scleral icterus.    Conjunctiva/sclera: Conjunctivae normal.     Pupils: Pupils are equal, round, and reactive to light.  Neck:     Trachea: No tracheal deviation.     Comments: Tenderness right posterior/lateral neck and trapezius area. Cardiovascular:     Rate and Rhythm: Normal rate.     Pulses: Normal pulses.   Pulmonary:     Effort: Pulmonary effort is normal. No respiratory distress.  Abdominal:     General: There is no distension.  Genitourinary:    Comments: No cva tenderness.  Musculoskeletal:        General: No swelling.     Cervical back: Normal range of motion and neck supple. No rigidity. No muscular tenderness.     Comments: C spine non tender, aligned.   Skin:    General: Skin is warm and dry.     Findings: No rash.  Neurological:     Mental Status: She is alert.     Comments: Alert, speech normal. Motor/sens grossly intact. Steady gait.   Psychiatric:        Mood and Affect: Mood normal.    ED Results / Procedures / Treatments   Labs (all labs ordered are listed, but only abnormal results are displayed) Labs Reviewed - No data to display  EKG None  Radiology CT Head Wo Contrast  Result Date: 10/17/2020 CLINICAL DATA:  fall x 1 week ago hitting head on metal truck bed , no LOC , c/o neck pain and h/a and increased BP EXAM: CT HEAD WITHOUT CONTRAST CT CERVICAL SPINE WITHOUT CONTRAST TECHNIQUE: Multidetector CT imaging of the head and cervical spine was performed following the standard protocol without intravenous contrast. Multiplanar CT image reconstructions of the cervical spine were also generated. COMPARISON:  CT head 11/12/2008 FINDINGS: CT HEAD FINDINGS Brain: No evidence of large-territorial acute infarction. No parenchymal hemorrhage. No mass lesion. No extra-axial collection. No mass effect or midline shift. No hydrocephalus. Basilar cisterns are patent. Vascular: No hyperdense vessel. Skull: No acute fracture or focal lesion. Sinuses/Orbits: Paranasal sinuses and mastoid air cells are clear. The orbits are unremarkable. Other: None. CT CERVICAL SPINE FINDINGS Alignment: Normal. Skull base and vertebrae: Multilevel degenerative changes of the spine including osteophyte formation, facet arthropathy, and uncovertebral arthropathy. No acute fracture. No aggressive appearing  focal osseous lesion or focal pathologic process. Soft tissues and spinal canal: No prevertebral fluid or swelling. No visible canal hematoma. Upper chest: Unremarkable. Other: None. IMPRESSION: 1. No acute intracranial abnormality. 2. No acute displaced fracture or traumatic listhesis of the cervical spine. Electronically Signed   By: Iven Finn M.D.   On: 10/17/2020 17:08   CT Cervical Spine Wo Contrast  Result Date: 10/17/2020 CLINICAL DATA:  fall x 1 week ago hitting head on metal truck bed , no LOC , c/o neck pain and h/a and increased BP EXAM: CT HEAD WITHOUT CONTRAST CT CERVICAL SPINE WITHOUT CONTRAST TECHNIQUE: Multidetector CT imaging of the head and cervical spine was performed following the standard protocol without intravenous contrast. Multiplanar CT image reconstructions of the cervical spine were also generated. COMPARISON:  CT head 11/12/2008 FINDINGS: CT HEAD  FINDINGS Brain: No evidence of large-territorial acute infarction. No parenchymal hemorrhage. No mass lesion. No extra-axial collection. No mass effect or midline shift. No hydrocephalus. Basilar cisterns are patent. Vascular: No hyperdense vessel. Skull: No acute fracture or focal lesion. Sinuses/Orbits: Paranasal sinuses and mastoid air cells are clear. The orbits are unremarkable. Other: None. CT CERVICAL SPINE FINDINGS Alignment: Normal. Skull base and vertebrae: Multilevel degenerative changes of the spine including osteophyte formation, facet arthropathy, and uncovertebral arthropathy. No acute fracture. No aggressive appearing focal osseous lesion or focal pathologic process. Soft tissues and spinal canal: No prevertebral fluid or swelling. No visible canal hematoma. Upper chest: Unremarkable. Other: None. IMPRESSION: 1. No acute intracranial abnormality. 2. No acute displaced fracture or traumatic listhesis of the cervical spine. Electronically Signed   By: Iven Finn M.D.   On: 10/17/2020 17:08    Procedures Procedures    Medications Ordered in ED Medications - No data to display  ED Course  I have reviewed the triage vital signs and the nursing notes.  Pertinent labs & imaging results that were available during my care of the patient were reviewed by me and considered in my medical decision making (see chart for details).    MDM Rules/Calculators/A&P                         Imaging studies ordered.   Reviewed nursing notes and prior charts for additional history.   CTs reviewed/interpreted by me - no hem or fx.   Rx robaxin.  Pt appears stable for d/c.     Final Clinical Impression(s) / ED Diagnoses Final diagnoses:  None    Rx / DC Orders ED Discharge Orders     None        Lajean Saver, MD 10/17/20 1730

## 2020-10-17 NOTE — ED Notes (Signed)
Hard c collar applied @ 1623 in triage by 2 nurses

## 2021-07-07 ENCOUNTER — Other Ambulatory Visit: Payer: Self-pay | Admitting: Family Medicine

## 2021-07-07 DIAGNOSIS — Z1231 Encounter for screening mammogram for malignant neoplasm of breast: Secondary | ICD-10-CM

## 2021-07-10 ENCOUNTER — Ambulatory Visit
Admission: RE | Admit: 2021-07-10 | Discharge: 2021-07-10 | Disposition: A | Discharge status: Home / Self Care | Payer: BC Managed Care – PPO | Source: Ambulatory Visit | Attending: Family Medicine | Admitting: Family Medicine

## 2021-07-10 DIAGNOSIS — Z1231 Encounter for screening mammogram for malignant neoplasm of breast: Secondary | ICD-10-CM

## 2022-04-16 IMAGING — [facility] DIGITAL SCREENING BREAST BILAT IMPLANT W/ TOMO W/ CAD
8 of 14 series · 8 of 34 positions shown · non-contrast
Comparison: Previous exam(s).

CLINICAL DATA: Screening.

EXAM:
DIGITAL SCREENING BILATERAL MAMMOGRAM WITH IMPLANTS, CAD AND
TOMOSYNTHESIS
TECHNIQUE: Bilateral screening digital craniocaudal and mediolateral oblique
mammograms were obtained. Bilateral screening digital breast
tomosynthesis was performed. The images were evaluated with
computer-aided detection. Standard and/or implant displaced views
were performed.

[R CC]
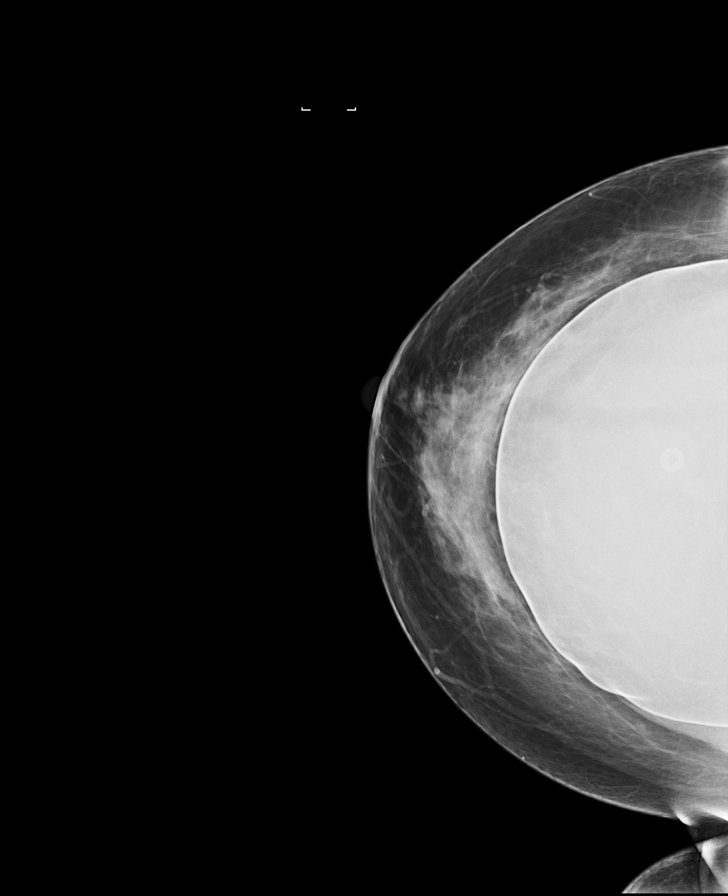

[L MLO]
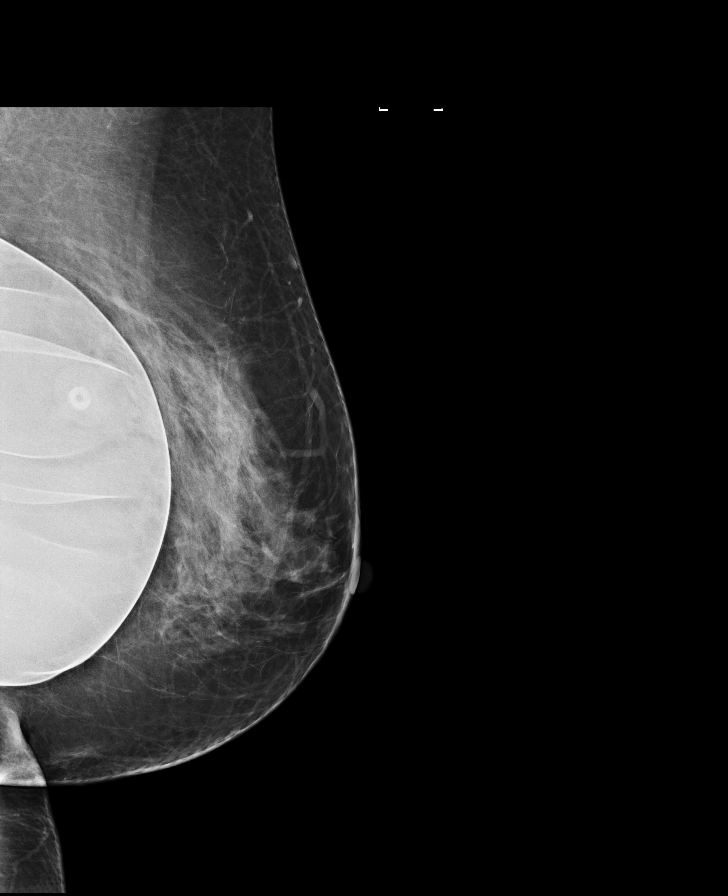

[R MLO]
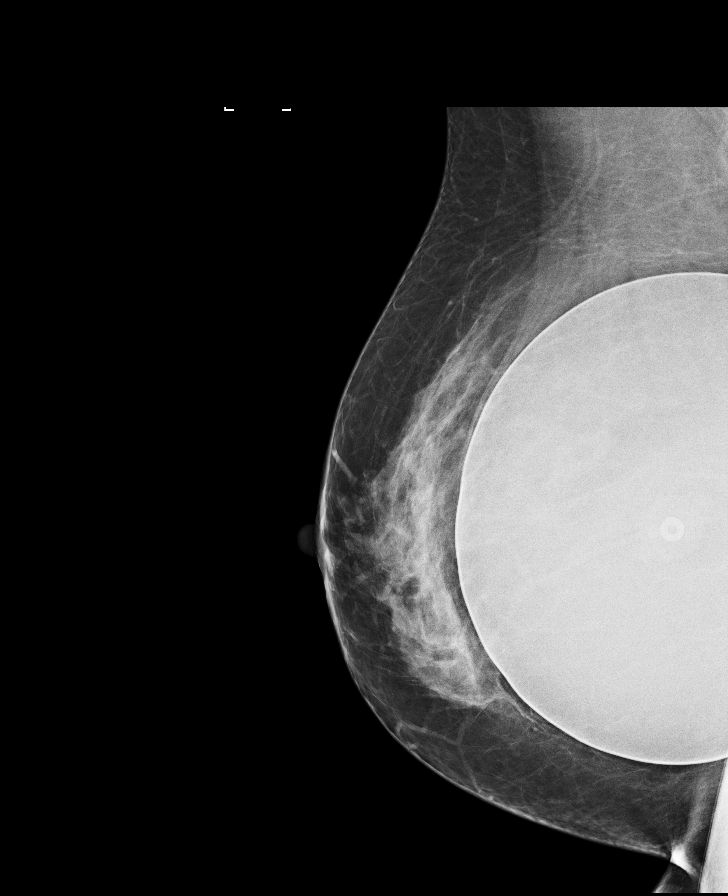

[L CC]
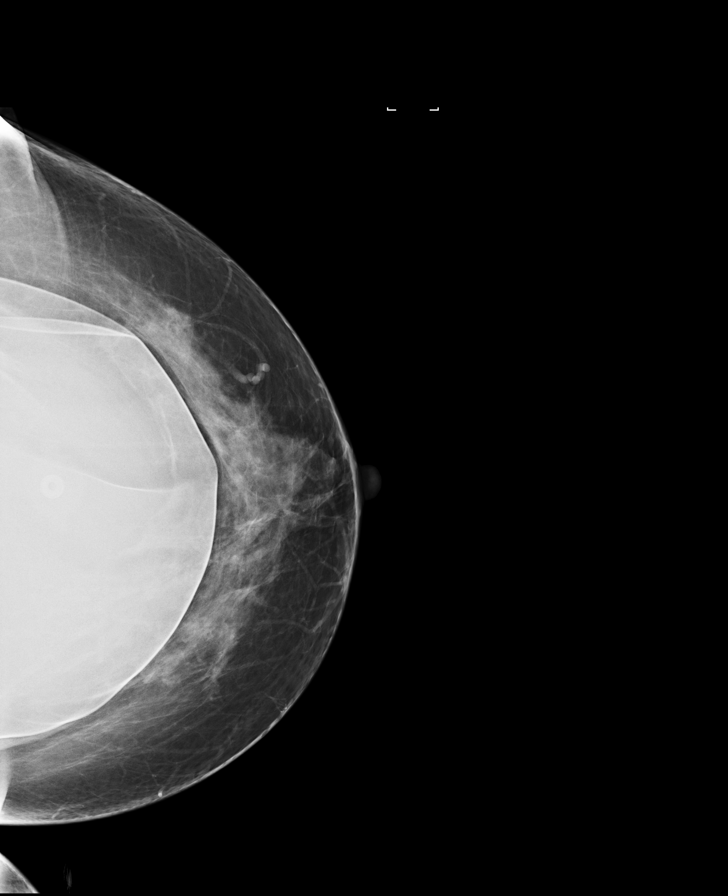

[R MLO synth-2D (1 of 2)]
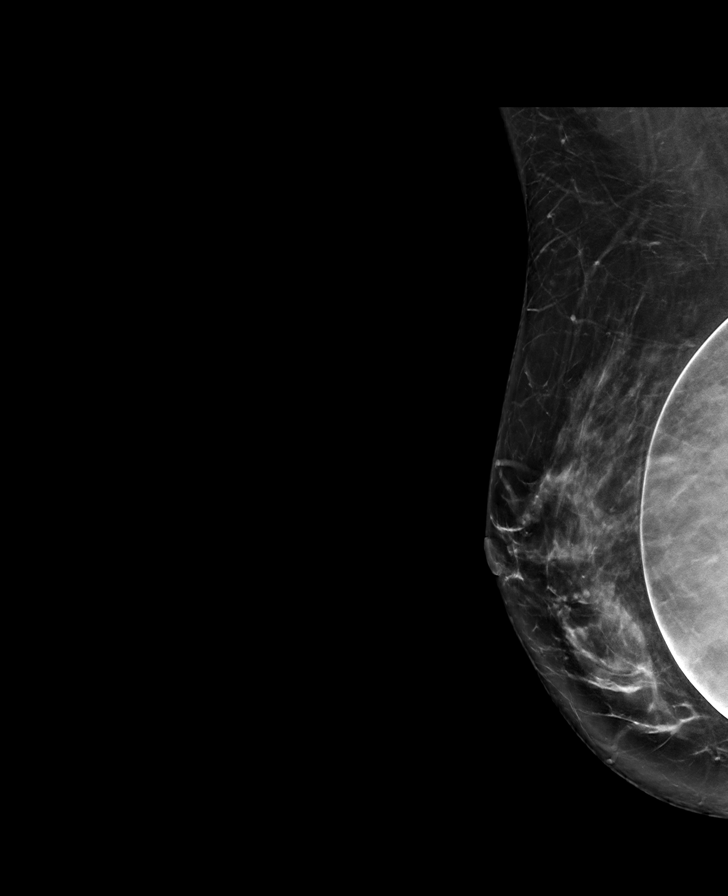

[L CC synth-2D]
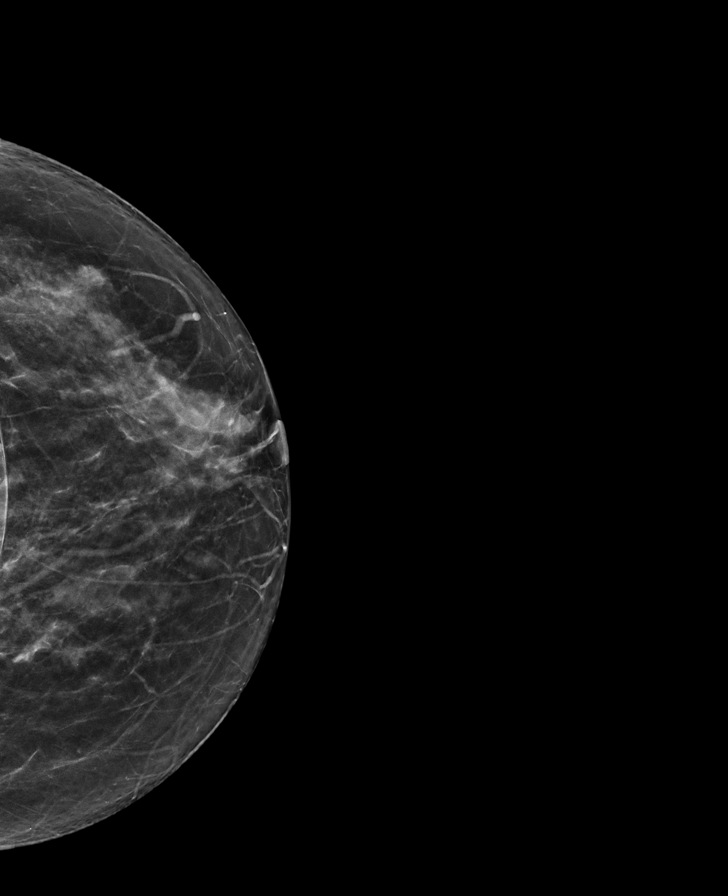

[R CC synth-2D]
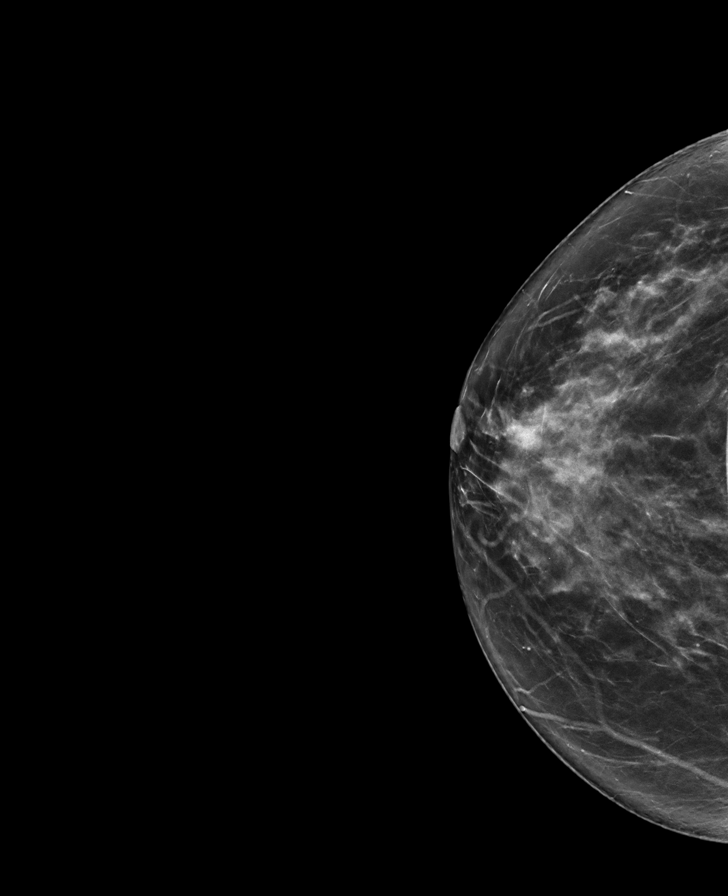

[R MLO synth-2D (2 of 2)]
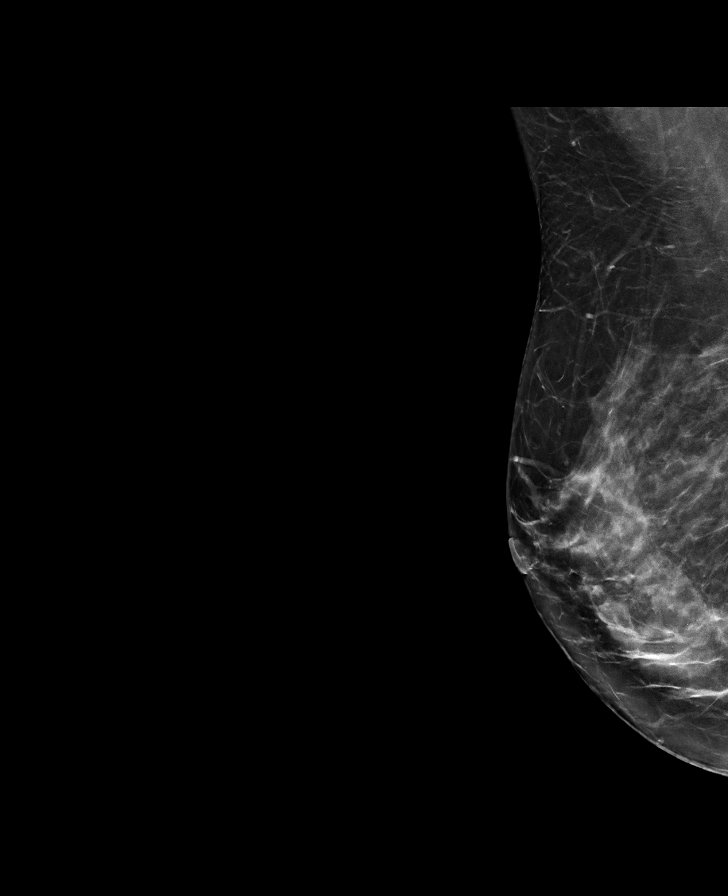

[8 of 34 positions shown; findings below may reference images not displayed]

ACR Breast Density Category c: The breast tissue is heterogeneously
dense, which may obscure small masses.
FINDINGS: The patient has retropectoral implants. There are no findings
suspicious for malignancy.
IMPRESSION: No mammographic evidence of malignancy. A result letter of this
screening mammogram will be mailed directly to the patient.

RECOMMENDATION:
Screening mammogram in one year. (Code:LT-E-7TH)

BI-RADS CATEGORY  1:  Negative.

## 2022-06-04 ENCOUNTER — Other Ambulatory Visit: Payer: Self-pay

## 2022-06-04 ENCOUNTER — Encounter (HOSPITAL_BASED_OUTPATIENT_CLINIC_OR_DEPARTMENT_OTHER): Payer: Self-pay | Admitting: Urology

## 2022-06-04 ENCOUNTER — Emergency Department (HOSPITAL_BASED_OUTPATIENT_CLINIC_OR_DEPARTMENT_OTHER)
Admission: EM | Admit: 2022-06-04 | Discharge: 2022-06-04 | Disposition: A | Discharge status: Home / Self Care | Payer: BC Managed Care – PPO | Attending: Emergency Medicine | Admitting: Emergency Medicine

## 2022-06-04 DIAGNOSIS — J101 Influenza due to other identified influenza virus with other respiratory manifestations: Secondary | ICD-10-CM | POA: Diagnosis not present

## 2022-06-04 DIAGNOSIS — R002 Palpitations: Secondary | ICD-10-CM | POA: Diagnosis not present

## 2022-06-04 DIAGNOSIS — Z20822 Contact with and (suspected) exposure to covid-19: Secondary | ICD-10-CM | POA: Insufficient documentation

## 2022-06-04 DIAGNOSIS — R059 Cough, unspecified: Secondary | ICD-10-CM | POA: Diagnosis present

## 2022-06-04 LAB — CBC WITH DIFFERENTIAL/PLATELET
Abs Immature Granulocytes: 0 10*3/uL (ref 0.00–0.07)
Basophils Absolute: 0 10*3/uL (ref 0.0–0.1)
Basophils Relative: 1 %
Eosinophils Absolute: 0.1 10*3/uL (ref 0.0–0.5)
Eosinophils Relative: 3 %
HCT: 43.5 % (ref 36.0–46.0)
Hemoglobin: 15.3 g/dL — ABNORMAL HIGH (ref 12.0–15.0)
Immature Granulocytes: 0 %
Lymphocytes Relative: 37 %
Lymphs Abs: 1.1 10*3/uL (ref 0.7–4.0)
MCH: 29.3 pg (ref 26.0–34.0)
MCHC: 35.2 g/dL (ref 30.0–36.0)
MCV: 83.3 fL (ref 80.0–100.0)
Monocytes Absolute: 0.5 10*3/uL (ref 0.1–1.0)
Monocytes Relative: 16 %
Neutro Abs: 1.2 10*3/uL — ABNORMAL LOW (ref 1.7–7.7)
Neutrophils Relative %: 43 %
Platelets: 189 10*3/uL (ref 150–400)
RBC: 5.22 MIL/uL — ABNORMAL HIGH (ref 3.87–5.11)
RDW: 12.4 % (ref 11.5–15.5)
Smear Review: NORMAL
WBC: 2.8 10*3/uL — ABNORMAL LOW (ref 4.0–10.5)
nRBC: 0 % (ref 0.0–0.2)

## 2022-06-04 LAB — COMPREHENSIVE METABOLIC PANEL
ALT: 24 U/L (ref 0–44)
AST: 21 U/L (ref 15–41)
Albumin: 4.1 g/dL (ref 3.5–5.0)
Alkaline Phosphatase: 62 U/L (ref 38–126)
Anion gap: 9 (ref 5–15)
BUN: 14 mg/dL (ref 6–20)
CO2: 18 mmol/L — ABNORMAL LOW (ref 22–32)
Calcium: 8.4 mg/dL — ABNORMAL LOW (ref 8.9–10.3)
Chloride: 106 mmol/L (ref 98–111)
Creatinine, Ser: 0.79 mg/dL (ref 0.44–1.00)
GFR, Estimated: >60 mL/min (ref >60)
Glucose, Bld: 79 mg/dL (ref 70–99)
Potassium: 3.9 mmol/L (ref 3.5–5.1)
Sodium: 133 mmol/L — ABNORMAL LOW (ref 135–145)
Total Bilirubin: 1 mg/dL (ref 0.3–1.2)
Total Protein: 6.9 g/dL (ref 6.5–8.1)

## 2022-06-04 LAB — RESP PANEL BY RT-PCR (RSV, FLU A&B, COVID)  RVPGX2
Influenza A by PCR: POSITIVE — AB
Influenza B by PCR: NEGATIVE
Resp Syncytial Virus by PCR: NEGATIVE
SARS Coronavirus 2 by RT PCR: NEGATIVE

## 2022-06-04 LAB — MAGNESIUM: Magnesium: 1.9 mg/dL (ref 1.7–2.4)

## 2022-06-04 MED ORDER — ONDANSETRON HCL 4 MG PO TABS: 4.0000 mg | ORAL_TABLET | Freq: Four times a day (QID) | ORAL | 0 refills | Status: DC

## 2022-06-04 MED ORDER — SODIUM CHLORIDE 0.9 % IV BOLUS
1000.0000 mL | Freq: Once | INTRAVENOUS | Status: AC
  Administered 2022-06-04: 1000 mL via INTRAVENOUS

## 2022-06-04 MED ORDER — ONDANSETRON HCL 4 MG/2ML IJ SOLN
4.0000 mg | Freq: Once | INTRAMUSCULAR | Status: AC
  Administered 2022-06-04: 4 mg via INTRAVENOUS
  Filled 2022-06-04: qty 2

## 2022-06-04 NOTE — Discharge Instructions (Signed)
You were seen in the emergency department for your cold symptoms and your palpitations.  You tested positive for the flu which is likely causing your congestion, vomiting and diarrhea.  Because you have had symptoms for more than 2 days, Tamiflu is unlikely to help and you can continue symptomatic management.  I have given you Zofran as needed for nausea and you can take Tylenol or Motrin as needed for headaches or bodyaches.  You should make sure that you are staying well-hydrated and you can follow-up with your primary doctor to have your symptoms rechecked.  Your heart remained in a normal rhythm while you are in the ER and your electrolytes were normal on your labs.  You can follow-up with cardiology regarding your palpitations and you may need to have a short-term heart monitor to see if you are going in and out of SVT or any other abnormal rhythms.  You should return to the emergency department if your heart is racing and does not improve with bearing down, you pass out, you have severe chest pain or if you have any other new or concerning symptoms.

## 2022-06-04 NOTE — ED Provider Notes (Signed)
[facility] Provider Note   CSN: [id number] Arrival date & time: 06/04/22  1101     History  Chief Complaint  Patient presents with   URI    Dawn Burton is a 52 y.o. female.  Is a 52 year old female with a past medical history of SVT status post ablation presenting to the emergency department with palpitations.  Patient states that last Tuesday she started to develop cold symptoms with cough, congestion, nausea, vomiting and diarrhea.  She states that she has no fevers but has had fatigue and feels generally weak all over.  She states that over the last several days she has had recurrent episodes of palpitations and is concerned she could be going in and out of SVT.  She states that the symptoms will last up to 9 minutes at a time and will resolve with coughing or bearing down.  She states that since she had her ablation several years ago she has had SVT occasionally but usually the symptoms resolve after 1 minute.  She states that she is not currently on any medications for SVT.  She denies any associated chest pain.  She states that when she feels the palpitations she feels short of breath but that resolves when the palpitations resolved.  The history is provided by the patient.  URI      Home Medications Prior to Admission medications   Medication Sig Start Date End Date Taking? Authorizing Provider  ondansetron (ZOFRAN) 4 MG tablet Take 1 tablet (4 mg total) by mouth every 6 (six) hours. 06/04/22  Yes Leanord Asal K, DO  HYDROcodone-acetaminophen (NORCO/VICODIN) 5-325 MG tablet Take 1-2 tablets by mouth every 6 (six) hours as needed for moderate pain or severe pain. 09/23/18   Jovita Kussmaul, MD  methocarbamol (ROBAXIN) 750 MG tablet Take 1 tablet (750 mg total) by mouth 3 (three) times daily as needed (muscle spasm/pain). 10/17/20   Lajean Saver, MD      Allergies    Levaquin [levofloxacin] and Other    Review of  Systems   Review of Systems  Physical Exam Updated Vital Signs BP 137/88 (BP Location: Left Arm)   Pulse 87   Temp 98 F (36.7 C) (Oral)   Resp 18   Ht '5\' 11"'$  (1.803 m)   Wt 102.1 kg   SpO2 97%   BMI 31.38 kg/m  Physical Exam Vitals and nursing note reviewed.  Constitutional:      General: She is not in acute distress.    Appearance: Normal appearance.  HENT:     Head: Normocephalic and atraumatic.     Nose: Congestion present.     Mouth/Throat:     Mouth: Mucous membranes are moist.     Pharynx: Oropharynx is clear.  Eyes:     Extraocular Movements: Extraocular movements intact.     Conjunctiva/sclera: Conjunctivae normal.  Cardiovascular:     Rate and Rhythm: Normal rate and regular rhythm.     Heart sounds: Normal heart sounds.  Pulmonary:     Effort: Pulmonary effort is normal.     Breath sounds: Normal breath sounds.  Abdominal:     General: Abdomen is flat.     Palpations: Abdomen is soft.     Tenderness: There is no abdominal tenderness.  Musculoskeletal:        General: Normal range of motion.     Cervical back: Normal range of motion and neck supple.  Skin:  General: Skin is warm and dry.  Neurological:     General: No focal deficit present.     Mental Status: She is alert and oriented to person, place, and time.  Psychiatric:        Mood and Affect: Mood normal.        Behavior: Behavior normal.     ED Results / Procedures / Treatments   Labs (all labs ordered are listed, but only abnormal results are displayed) Labs Reviewed  RESP PANEL BY RT-PCR (RSV, FLU A&B, COVID)  RVPGX2 - Abnormal; Notable for the following components:      Result Value   Influenza A by PCR POSITIVE (*)    All other components within normal limits  CBC WITH DIFFERENTIAL/PLATELET - Abnormal; Notable for the following components:   WBC 2.8 (*)    RBC 5.22 (*)    Hemoglobin 15.3 (*)    All other components within normal limits  COMPREHENSIVE METABOLIC PANEL - Abnormal;  Notable for the following components:   Sodium 133 (*)    CO2 18 (*)    Calcium 8.4 (*)    All other components within normal limits  MAGNESIUM    EKG EKG Interpretation  Date/Time:  Monday June 04 2022 14:28:06 EST Ventricular Rate:  68 PR Interval:  118 QRS Duration: 102 QT Interval:  405 QTC Calculation: 431 R Axis:   51 Text Interpretation: Sinus rhythm Borderline short PR interval Low voltage, precordial leads Baseline wander in lead(s) V3 No significant change since last tracing Confirmed by Leanord Asal ([id number]) on 06/04/2022 2:29:20 PM  Radiology No results found.  Procedures Procedures    Medications Ordered in ED Medications  sodium chloride 0.9 % bolus 1,000 mL (1,000 mLs Intravenous New Bag/Given 06/04/22 1445)  ondansetron (ZOFRAN) injection 4 mg (4 mg Intravenous Given 06/04/22 1445)    ED Course/ Medical Decision Making/ A&P Clinical Course as of 06/04/22 1527  Mon Jun 04, 2022  1519 Labs within normal range. She has remained in sinus rhythm in the ED and is stable for discharge home with primary and cardiology follow up. [VK]  1520 Sodium(!): 133 [VK]    Clinical Course User Index [VK] Kemper Durie, DO                             Medical Decision Making This patient presents to the ED with chief complaint(s) of viral syndrome, palpitations with pertinent past medical history of SVT which further complicates the presenting complaint. The complaint involves an extensive differential diagnosis and also carries with it a high risk of complications and morbidity.    The differential diagnosis includes arrhythmia, anemia, electrolyte abnormality, dehydration, viral syndrome, patient has no focal lung sounds and is satting well on room air making pneumonia unlikely  Additional history obtained: Additional history obtained from N/A Records reviewed N/A  ED Course and Reassessment: On patient's arrival to the emergency department she was  hemodynamically stable with a normal heart rate.  EKG will be performed to evaluate for arrhythmia and she will be placed on cardiac monitor.  She was swabbed in triage and tested positive for the flu.  Due to today being day 6 or 7 of symptoms she is outside the window for Tamiflu treatment.  She will be given fluids and have labs performed to evaluate for electrolyte abnormality or anemia and will be closely reassessed.  Independent labs interpretation:  The following labs were independently  interpreted: Flu A positive, otherwise within normal range  Independent visualization of imaging: N/A  Consultation: - Consulted or discussed management/test interpretation w/ external professional: N/A  Consideration for admission or further workup: Patient has no emergent conditions requiring admission or further work-up at this time and is stable for discharge home with primary care and cardiology follow-up  Social Determinants of health: N/A    Amount and/or Complexity of Data Reviewed Labs: ordered. Decision-making details documented in ED Course.  Risk Prescription drug management.          Final Clinical Impression(s) / ED Diagnoses Final diagnoses:  Influenza A  Palpitations    Rx / DC Orders ED Discharge Orders          Ordered    ondansetron (ZOFRAN) 4 MG tablet  Every 6 hours        06/04/22 1524    Ambulatory referral to Cardiology       Comments: If you have not heard from the Cardiology office within the next 72 hours please call [phone].   06/04/22 [place], [place], DO 06/04/22 1527

## 2022-06-04 NOTE — ED Triage Notes (Signed)
Pt states covid symptoms x 5 days  States feels like HR is very fast States fever,. Coiugh, N/V/D, body aches  HR 85 at triage   States h/o ablation 25 years ago

## 2022-07-17 DIAGNOSIS — F32A Depression, unspecified: Secondary | ICD-10-CM | POA: Insufficient documentation

## 2022-07-17 DIAGNOSIS — I471 Supraventricular tachycardia, unspecified: Secondary | ICD-10-CM | POA: Insufficient documentation

## 2022-07-17 DIAGNOSIS — I341 Nonrheumatic mitral (valve) prolapse: Secondary | ICD-10-CM | POA: Insufficient documentation

## 2022-07-17 DIAGNOSIS — G43909 Migraine, unspecified, not intractable, without status migrainosus: Secondary | ICD-10-CM | POA: Insufficient documentation

## 2022-07-24 ENCOUNTER — Ambulatory Visit: Payer: BC Managed Care – PPO | Admitting: Cardiology

## 2022-10-01 ENCOUNTER — Ambulatory Visit: Payer: BC Managed Care – PPO | Attending: Cardiology | Admitting: Cardiology

## 2022-10-01 ENCOUNTER — Telehealth: Payer: Self-pay

## 2022-10-01 ENCOUNTER — Ambulatory Visit: Payer: BC Managed Care – PPO | Attending: Cardiology

## 2022-10-01 ENCOUNTER — Encounter: Payer: Self-pay | Admitting: Cardiology

## 2022-10-01 VITALS — BP 118/72 | HR 62 | Ht 72.0 in | Wt 224.0 lb

## 2022-10-01 DIAGNOSIS — I341 Nonrheumatic mitral (valve) prolapse: Secondary | ICD-10-CM

## 2022-10-01 DIAGNOSIS — R002 Palpitations: Secondary | ICD-10-CM | POA: Diagnosis not present

## 2022-10-01 DIAGNOSIS — I471 Supraventricular tachycardia, unspecified: Secondary | ICD-10-CM | POA: Diagnosis not present

## 2022-10-01 NOTE — Addendum Note (Signed)
Addended by: Baldo Ash D on: 10/01/2022 10:25 AM   Modules accepted: Orders

## 2022-10-01 NOTE — Patient Instructions (Signed)
Medication Instructions:  Your physician recommends that you continue on your current medications as directed. Please refer to the Current Medication list given to you today.  *If you need a refill on your cardiac medications before your next appointment, please call your pharmacy*   Lab Work: None Ordered If you have labs (blood work) drawn today and your tests are completely normal, you will receive your results only by: MyChart Message (if you have MyChart) OR A paper copy in the mail If you have any lab test that is abnormal or we need to change your treatment, we will call you to review the results.   Testing/Procedures:  WHY IS MY DOCTOR PRESCRIBING ZIO? The Zio system is proven and trusted by physicians to detect and diagnose irregular heart rhythms -- and has been prescribed to hundreds of thousands of patients.  The FDA has cleared the Zio system to monitor for many different kinds of irregular heart rhythms. In a study, physicians were able to reach a diagnosis 90% of the time with the Zio system1.  You can wear the Zio monitor -- a small, discreet, comfortable patch -- during your normal day-to-day activity, including while you sleep, shower, and exercise, while it records every single heartbeat for analysis.  1Barrett, P., et al. Comparison of 24 Hour Holter Monitoring Versus 14 Day Novel Adhesive Patch Electrocardiographic Monitoring. American Journal of Medicine, 2014.  ZIO VS. HOLTER MONITORING The Zio monitor can be comfortably worn for up to 14 days. Holter monitors can be worn for 24 to 48 hours, limiting the time to record any irregular heart rhythms you may have. Zio is able to capture data for the 51% of patients who have their first symptom-triggered arrhythmia after 48 hours.1  LIVE WITHOUT RESTRICTIONS The Zio ambulatory cardiac monitor is a small, unobtrusive, and water-resistant patch--you might even forget you're wearing it. The Zio monitor records and stores  every beat of your heart, whether you're sleeping, working out, or showering.    Your physician has requested that you have an echocardiogram. Echocardiography is a painless test that uses sound waves to create images of your heart. It provides your doctor with information about the size and shape of your heart and how well your heart's chambers and valves are working. This procedure takes approximately one hour. There are no restrictions for this procedure. Please do NOT wear cologne, perfume, aftershave, or lotions (deodorant is allowed). Please arrive 15 minutes prior to your appointment time.    Follow-Up: At [facility], you and your health needs are our priority.  As part of our continuing mission to provide you with exceptional heart care, we have created designated Provider Care Teams.  These Care Teams include your primary Cardiologist (physician) and Advanced Practice Providers (APPs -  Physician Assistants and Nurse Practitioners) who all work together to provide you with the care you need, when you need it.  We recommend signing up for the patient portal called "MyChart".  Sign up information is provided on this After Visit Summary.  MyChart is used to connect with patients for Virtual Visits (Telemedicine).  Patients are able to view lab/test results, encounter notes, upcoming appointments, etc.  Non-urgent messages can be sent to your provider as well.   To learn more about what you can do with MyChart, go to [email or web address].    Your next appointment:   2 month(s)  The format for your next appointment:   In Person  Provider:   Robert Krasowski, MD      Other Instructions NA  

## 2022-10-01 NOTE — Telephone Encounter (Signed)
Patient notified through my chart.

## 2022-10-01 NOTE — Telephone Encounter (Signed)
-----   Message from Garwin Brothers, MD sent at 10/01/2022 10:29 AM EDT ----- The results of the study is unremarkable. Please inform patient. I will discuss in detail at next appointment. Cc  primary care/referring physician Garwin Brothers, MD 10/01/2022 10:29 AM

## 2022-10-01 NOTE — Progress Notes (Signed)
Cardiology Consultation:    Date:  10/01/2022   ID:  Dawn Burton, DOB [birth year], MRN [id number]  PCP:  Shirlean Mylar, MD  Cardiologist:  Gypsy Balsam, MD   Referring MD: Rexford Maus, DO   Chief Complaint  Patient presents with   Tachycardia    History of Present Illness:    Dawn Burton is a 52 y.o. female who is being seen today for the evaluation of palpitations at the request of Rexford Maus, DO.  Past medical history significant for supraventricular tachycardia she did have ablation done in 1999, history of mitral valve prolapse, migraines, depression.  She was referred to [place] because she started experiencing palpitations again.  It happened she was sick with the flu.  However this time she was not able to break the arrhythmia with normal typical Valsalva maneuvers that she literally have to do previously when she got recurrent episode of supraventricular tachycardia.  Overall she describes episode of palpitations a rare it happens usually when she is sick.  Usually abrupt onset she may sweat a little bit may feel anxious when she has it last few minutes up to 10 minutes.  She never passed out because of this she does not have any chest pain when it happens.  Otherwise she is very healthy.  She is trying to be active she walks on the regular basis no difficulties.  Quit smoking many years ago I do have cholesterol from 2022 with LDL 122 HDL 60.  Does send have family history of premature coronary artery disease but apparently her brother was find to have atrial fibrillation.  Past Medical History:  Diagnosis Date   Acute appendicitis 09/18/2015   Depression    Migraines    MVP (mitral valve prolapse)    SVT (supraventricular tachycardia)    had ablation in 1999     Past Surgical History:  Procedure Laterality Date   ABDOMINAL HYSTERECTOMY     APPENDECTOMY     09/2015   AUGMENTATION MAMMAPLASTY Bilateral    BREAST EXCISIONAL  BIOPSY Left 09/23/2018   benign   BREAST LUMPECTOMY WITH RADIOACTIVE SEED LOCALIZATION Left 09/23/2018   Procedure: LEFT BREAST LUMPECTOMY WITH RADIOACTIVE SEED LOCALIZATION;  Surgeon: Griselda Miner, MD;  Location: [facility];  Service: General;  Laterality: Left;   CARDIAC ELECTROPHYSIOLOGY STUDY AND ABLATION  1999   LAPAROSCOPIC APPENDECTOMY N/A 09/19/2015   Procedure: LAPAROSCOPIC APPENDECTOMY;  Surgeon: Almond Lint, MD;  Location: [facility];  Service: General;  Laterality: N/A;    Current Medications: Current Meds  Medication Sig   [DISCONTINUED] HYDROcodone-acetaminophen (NORCO/VICODIN) 5-325 MG tablet Take 1-2 tablets by mouth every 6 (six) hours as needed for moderate pain or severe pain.   [DISCONTINUED] methocarbamol (ROBAXIN) 750 MG tablet Take 1 tablet (750 mg total) by mouth 3 (three) times daily as needed (muscle spasm/pain).   [DISCONTINUED] ondansetron (ZOFRAN) 4 MG tablet Take 1 tablet (4 mg total) by mouth every 6 (six) hours.     Allergies:   Amoxicillin, Amoxicillin-pot clavulanate, Levaquin [levofloxacin], and Other   Social History   Socioeconomic History   Marital status: Divorced    Spouse name: Not on file   Number of children: Not on file   Years of education: Not on file   Highest education level: Not on file  Occupational History   Not on file  Tobacco Use   Smoking status: Never   Smokeless tobacco: Never  Vaping Use   Vaping Use: Never  used  Substance and Sexual Activity   Alcohol use: Yes    Comment: occasional   Drug use: No   Sexual activity: Not on file  Other Topics Concern   Not on file  Social History Narrative   Not on file   Social Determinants of Health   Financial Resource Strain: Not on file  Food Insecurity: Not on file  Transportation Needs: Not on file  Physical Activity: Not on file  Stress: Not on file  Social Connections: Not on file     Family History: The patient's family history includes Breast  cancer in her maternal grandmother, mother, and paternal grandmother. ROS:   Please see the history of present illness.    All 14 point review of systems negative except as described per history of present illness.  EKGs/Labs/Other Studies Reviewed:    The following studies were reviewed today:   EKG:     Normal sinus rhythm, normal P interval, normal QS complex duration morphology no ST segment changes  Recent Labs: 06/04/2022: ALT 24; BUN 14; Creatinine, Ser 0.79; Hemoglobin 15.3; Magnesium 1.9; Platelets 189; Potassium 3.9; Sodium 133  Recent Lipid Panel No results found for: "CHOL", "TRIG", "HDL", "CHOLHDL", "VLDL", "LDLCALC", "LDLDIRECT"  Physical Exam:    VS:  BP 118/72 (BP Location: Left Arm, Patient Position: Sitting)   Pulse 62   Ht 6' (1.829 m)   Wt 224 lb (101.6 kg)   SpO2 96%   BMI 30.38 kg/m     Wt Readings from Last 3 Encounters:  10/01/22 224 lb (101.6 kg)  06/04/22 225 lb (102.1 kg)  10/17/20 218 lb (98.9 kg)     GEN:  Well nourished, well developed in no acute distress HEENT: Normal NECK: No JVD; No carotid bruits LYMPHATICS: No lymphadenopathy CARDIAC: RRR, no murmurs, no rubs, no gallops RESPIRATORY:  Clear to auscultation without rales, wheezing or rhonchi  ABDOMEN: Soft, non-tender, non-distended MUSCULOSKELETAL:  No edema; No deformity  SKIN: Warm and dry NEUROLOGIC:  Alert and oriented x 3 PSYCHIATRIC:  Normal affect   ASSESSMENT:    1. SVT (supraventricular tachycardia)   2. MVP (mitral valve prolapse)   3. Palpitations    PLAN:    In order of problems listed above:  Supraventricular tachycardia history of.  Now she is has recurrent palpitations.  I will put Zio patch for 2 weeks to see if we can catch an arrhythmia.  She also does have an Apple Watch that allowed her to record EKG and I told her if we fail to catch arrhythmia while wearing monitor machine to try to catch it on the Apple Watch.  In the meantime I will also ask her to have  an echocardiogram to make sure her heart is structurally normal. He is to have mitral valve prolapse I do not hear any click today on the physical exam but will do echocardiogram to assess that. Palpitations plan as described above. Dyslipidemia I do have numbers from 2022.  Will continue monitoring for now.   Medication Adjustments/Labs and Tests Ordered: Current medicines are reviewed at length with the patient today.  Concerns regarding medicines are outlined above.  Orders Placed This Encounter  Procedures   EKG 12-Lead   No orders of the defined types were placed in this encounter.   Signed, Georgeanna Lea, MD, [facility]. 10/01/2022 10:04 AM    [facility]

## 2022-10-01 NOTE — Telephone Encounter (Signed)
-----   Message from Rajan R Revankar, MD sent at 10/01/2022 10:29 AM EDT ----- The results of the study is unremarkable. Please inform patient. I will discuss in detail at next appointment. Cc  primary care/referring physician Rajan R Revankar, MD 10/01/2022 10:29 AM  

## 2022-11-01 ENCOUNTER — Telehealth: Payer: Self-pay

## 2022-11-01 NOTE — Telephone Encounter (Signed)
Pt viewed results in My Chart per Dr. Krasowski's note. Routed to PCP.  

## 2022-11-22 ENCOUNTER — Ambulatory Visit (HOSPITAL_BASED_OUTPATIENT_CLINIC_OR_DEPARTMENT_OTHER): Payer: BC Managed Care – PPO | Attending: Cardiology

## 2022-12-11 ENCOUNTER — Ambulatory Visit: Payer: BC Managed Care – PPO | Admitting: Cardiology

## 2023-03-29 ENCOUNTER — Ambulatory Visit: Payer: BC Managed Care – PPO | Attending: Cardiology | Admitting: Cardiology

## 2023-09-14 ENCOUNTER — Emergency Department (HOSPITAL_BASED_OUTPATIENT_CLINIC_OR_DEPARTMENT_OTHER)
Admission: EM | Admit: 2023-09-14 | Discharge: 2023-09-14 | Disposition: A | Discharge status: Home / Self Care | Attending: Emergency Medicine | Admitting: Emergency Medicine

## 2023-09-14 ENCOUNTER — Other Ambulatory Visit: Payer: Self-pay

## 2023-09-14 ENCOUNTER — Emergency Department (HOSPITAL_BASED_OUTPATIENT_CLINIC_OR_DEPARTMENT_OTHER)

## 2023-09-14 ENCOUNTER — Encounter (HOSPITAL_BASED_OUTPATIENT_CLINIC_OR_DEPARTMENT_OTHER): Payer: Self-pay

## 2023-09-14 DIAGNOSIS — W19XXXA Unspecified fall, initial encounter: Secondary | ICD-10-CM

## 2023-09-14 DIAGNOSIS — S20212A Contusion of left front wall of thorax, initial encounter: Secondary | ICD-10-CM | POA: Diagnosis not present

## 2023-09-14 DIAGNOSIS — S299XXA Unspecified injury of thorax, initial encounter: Secondary | ICD-10-CM | POA: Diagnosis present

## 2023-09-14 DIAGNOSIS — W500XXA Accidental hit or strike by another person, initial encounter: Secondary | ICD-10-CM | POA: Diagnosis not present

## 2023-09-14 MED ORDER — METHOCARBAMOL 500 MG PO TABS: 500.0000 mg | ORAL_TABLET | Freq: Two times a day (BID) | ORAL | 0 refills | Status: AC

## 2023-09-14 NOTE — ED Triage Notes (Signed)
 Was lifting disabled daughter into bath last night and fell forwards and daughters knees went into lower ribs/upper stomach. C/o pain. No bruising noted. Denies hitting head/LOC.

## 2023-09-14 NOTE — Discharge Instructions (Addendum)
 Please use Tylenol  or ibuprofen for pain.  You may use 600 mg ibuprofen every 6 hours or 1000 mg of Tylenol  every 6 hours.  You may choose to alternate between the 2.  This would be most effective.  Not to exceed 4 g of Tylenol  within 24 hours.  Not to exceed 3200 mg ibuprofen 24 hours.  You can use the muscle relaxant I am prescribing in addition to the above to help with any breakthrough pain.  You can take it up to twice daily.  It is safe to take at night, but I would be cautious taking it during the day as it can cause some drowsiness.  Make sure that you are feeling awake and alert before you get behind the wheel of a car or operate a motor vehicle.  It is not a narcotic pain medication so you are able to take it if it is not making you drowsy and still pilot a vehicle or machinery safely.  Complete the lidocaine  patches directly where it hurts.  With rib bruising I would expect 3 to 4 weeks for symptoms to feel improved.  Please return if you have significant worsening pain or need additional pain control despite all of the above.

## 2023-09-14 NOTE — ED Provider Notes (Signed)
 [place] [facility] Provider Note   CSN: [id number] Arrival date & time: 09/14/23  0820     Patient presents with: Dawn Burton   Dawn Burton is a 53 y.o. female with past medical history significant for previous appendicitis, previous hysterectomy, but no recent surgery, otherwise overall noncontributory past medical history who presents with concern for pain in the upper stomach, left rib cage after lifting her daughter last night into the bathtub, falling forward and having her daughter's knees fall into her chest with some force.  She denies hitting her head, loss of consciousness.  She reports around 4/10 pain at rest, 8/10 with deep breathing or palpation.  Has taken Tylenol  so far with some improvement.  Reports she overall feels okay, just wants to make sure that there is no serious internal injury.    Fall       Prior to Admission medications   Medication Sig Start Date End Date Taking? Authorizing Provider  methocarbamol  (ROBAXIN ) 500 MG tablet Take 1 tablet (500 mg total) by mouth 2 (two) times daily. 09/14/23  Yes Rayana Geurin H, PA-C    Allergies: Amoxicillin, Amoxicillin-pot clavulanate, Levaquin [levofloxacin], and Other    Review of Systems  All other systems reviewed and are negative.   Updated Vital Signs BP 98/78 (BP Location: Right Arm)   Pulse 77   Temp 98 F (36.7 C) (Oral)   Resp 16   Ht 6' (1.829 m)   Wt 90.7 kg   SpO2 97%   BMI 27.12 kg/m   Physical Exam Vitals and nursing note reviewed.  Constitutional:      General: She is not in acute distress.    Appearance: Normal appearance.  HENT:     Head: Normocephalic and atraumatic.   Eyes:     General:        Right eye: No discharge.        Left eye: No discharge.    Cardiovascular:     Rate and Rhythm: Normal rate and regular rhythm.     Heart sounds: No murmur heard.    No friction rub. No gallop.  Pulmonary:     Effort: Pulmonary effort is  normal.     Breath sounds: Normal breath sounds.  Abdominal:     General: Bowel sounds are normal.     Palpations: Abdomen is soft.     Comments: No bruising of the abdomen, overall her anterior abdominal wall is focally nontender, nondistended, no rigidity, no rebound, no guarding.   Musculoskeletal:     Comments: Fairly significant focal tenderness of the left lower rib cage to palpation, no obvious step-off or deformity.   Skin:    General: Skin is warm and dry.     Capillary Refill: Capillary refill takes less than 2 seconds.   Neurological:     Mental Status: She is alert and oriented to person, place, and time.   Psychiatric:        Mood and Affect: Mood normal.        Behavior: Behavior normal.     (all labs ordered are listed, but only abnormal results are displayed) Labs Reviewed - No data to display  EKG: None  Radiology: DG Chest Portable 1 View Result Date: 09/14/2023 CLINICAL DATA:  Chest and epigastric pain. Recent fall. Shortness of breath. EXAM: PORTABLE CHEST 1 VIEW COMPARISON:  08/18/2008 FINDINGS: The heart size and mediastinal contours are within normal limits. Both lungs are clear. No pneumothorax or  hemothorax visualized. The visualized skeletal structures are unremarkable. IMPRESSION: No active disease. Electronically Signed   By: Marlyce Sine M.D.   On: 09/14/2023 09:42     Procedures   Medications Ordered in the ED - No data to display                                  Medical Decision Making Amount and/or Complexity of Data Reviewed Radiology: ordered.   This patient is a 53 y.o. female who presents to the ED for concern of fall, abdominal wall pain, lower rib cage pain..   Differential diagnoses prior to evaluation: Rib fracture, rib contusion, intra-abdominal injury, hollow viscus or solid organ injury, including liver or splenic laceration, versus other.  Past Medical History / Social History / Additional history: Chart reviewed.  Pertinent results include: Overall noncontributory, not taking blood thinners.  Physical Exam: Physical exam performed. The pertinent findings include: No significant bruising noted to chest or abdomen wall.  No distention, rebound, rigidity, guarding.  She does have some focal tenderness of the left lower rib cage without obvious deformity.  Normal breath sounds bilaterally.  Normal work of breathing bilaterally.  I independently interpreted imaging including plain film radiograph of the chest which shows no evidence of acute intrathoracic abnormality, no evidence of obvious rib fracture or deformity.  No air in chest cavity. I agree with the radiologist interpretation.  Medications / Treatment: Discussed with patient that it is possible she has a nondisplaced rib fracture versus bruised rib, discussed the treatment is overall the same, encouraged lidocaine  patches, muscle relaxants, ibuprofen, Tylenol , rest, PCP/orthopedic follow-up as needed, or return to the emergency department if symptoms worsen.  Patient understands and agrees to plan, return precautions given.   Disposition: After consideration of the diagnostic results and the patients response to treatment, I feel that patient stable for discharge with plan as above.   emergency department workup does not suggest an emergent condition requiring admission or immediate intervention beyond what has been performed at this time. The plan is: as above. The patient is safe for discharge and has been instructed to return immediately for worsening symptoms, change in symptoms or any other concerns.   Final diagnoses:  Fall, initial encounter  Contusion of rib on left side, initial encounter    ED Discharge Orders          Ordered    methocarbamol  (ROBAXIN ) 500 MG tablet  2 times daily        09/14/23 0949               Devinne Epstein, Cosimo Diones, PA-C 09/14/23 1027    Mozell Arias, MD 09/15/23 [id number]

## 2024-03-13 ENCOUNTER — Other Ambulatory Visit (HOSPITAL_BASED_OUTPATIENT_CLINIC_OR_DEPARTMENT_OTHER): Payer: Self-pay | Admitting: Family Medicine

## 2024-03-13 DIAGNOSIS — E785 Hyperlipidemia, unspecified: Secondary | ICD-10-CM

## 2024-03-23 ENCOUNTER — Inpatient Hospital Stay (HOSPITAL_BASED_OUTPATIENT_CLINIC_OR_DEPARTMENT_OTHER): Admission: RE | Admit: 2024-03-23 | Source: Ambulatory Visit

## 2024-03-30 ENCOUNTER — Ambulatory Visit (HOSPITAL_BASED_OUTPATIENT_CLINIC_OR_DEPARTMENT_OTHER)
Admission: RE | Admit: 2024-03-30 | Discharge: 2024-03-30 | Disposition: A | Discharge status: Home / Self Care | Source: Ambulatory Visit | Attending: Family Medicine | Admitting: Family Medicine

## 2024-03-30 DIAGNOSIS — E785 Hyperlipidemia, unspecified: Secondary | ICD-10-CM | POA: Insufficient documentation
